# Patient Record
Sex: Male | Born: 1970 | Race: White | Hispanic: No | Marital: Married | Smoking: Never smoker
Health system: Southern US, Community
[De-identification: ages and names within clinical notes are randomized; demographics above are authoritative.]

## PROBLEM LIST (undated history)

## (undated) DIAGNOSIS — T8859XA Other complications of anesthesia, initial encounter: Secondary | ICD-10-CM

## (undated) HISTORY — PX: SHOULDER SURGERY: SHX246

## (undated) HISTORY — PX: HERNIA REPAIR: SHX51

## (undated) HISTORY — PX: KNEE SURGERY: SHX244

---

## 1998-02-14 ENCOUNTER — Emergency Department (HOSPITAL_COMMUNITY): Admission: EM | Admit: 1998-02-14 | Discharge: 1998-02-14 | Payer: Self-pay | Admitting: Emergency Medicine

## 1998-02-14 ENCOUNTER — Encounter: Payer: Self-pay | Admitting: Emergency Medicine

## 1998-02-15 ENCOUNTER — Encounter: Admission: RE | Admit: 1998-02-15 | Discharge: 1998-05-16 | Payer: Self-pay | Admitting: *Deleted

## 1998-02-21 ENCOUNTER — Encounter: Admission: RE | Admit: 1998-02-21 | Discharge: 1998-05-22 | Payer: Self-pay | Admitting: *Deleted

## 1998-09-15 ENCOUNTER — Emergency Department (HOSPITAL_COMMUNITY): Admission: EM | Admit: 1998-09-15 | Discharge: 1998-09-15 | Payer: Self-pay | Admitting: Emergency Medicine

## 1998-09-15 ENCOUNTER — Encounter: Payer: Self-pay | Admitting: Emergency Medicine

## 1999-11-07 ENCOUNTER — Emergency Department (HOSPITAL_COMMUNITY): Admission: EM | Admit: 1999-11-07 | Discharge: 1999-11-07 | Payer: Self-pay | Admitting: Emergency Medicine

## 2000-01-18 ENCOUNTER — Emergency Department (HOSPITAL_COMMUNITY): Admission: EM | Admit: 2000-01-18 | Discharge: 2000-01-18 | Payer: Self-pay | Admitting: *Deleted

## 2000-04-24 ENCOUNTER — Emergency Department (HOSPITAL_COMMUNITY): Admission: EM | Admit: 2000-04-24 | Discharge: 2000-04-24 | Payer: Self-pay | Admitting: Emergency Medicine

## 2000-04-24 ENCOUNTER — Encounter: Payer: Self-pay | Admitting: Emergency Medicine

## 2001-06-03 ENCOUNTER — Emergency Department (HOSPITAL_COMMUNITY): Admission: EM | Admit: 2001-06-03 | Discharge: 2001-06-03 | Payer: Self-pay | Admitting: Emergency Medicine

## 2001-07-10 ENCOUNTER — Emergency Department (HOSPITAL_COMMUNITY): Admission: EM | Admit: 2001-07-10 | Discharge: 2001-07-10 | Payer: Self-pay | Admitting: *Deleted

## 2002-08-04 ENCOUNTER — Emergency Department (HOSPITAL_COMMUNITY): Admission: EM | Admit: 2002-08-04 | Discharge: 2002-08-04 | Payer: Self-pay | Admitting: Emergency Medicine

## 2003-04-21 ENCOUNTER — Emergency Department (HOSPITAL_COMMUNITY): Admission: EM | Admit: 2003-04-21 | Discharge: 2003-04-21 | Payer: Self-pay | Admitting: Emergency Medicine

## 2008-03-30 ENCOUNTER — Emergency Department (HOSPITAL_COMMUNITY): Admission: EM | Admit: 2008-03-30 | Discharge: 2008-03-30 | Payer: Self-pay | Admitting: Emergency Medicine

## 2013-11-09 ENCOUNTER — Encounter (HOSPITAL_COMMUNITY): Payer: Self-pay | Admitting: Emergency Medicine

## 2013-11-09 ENCOUNTER — Emergency Department (HOSPITAL_COMMUNITY): Payer: Managed Care, Other (non HMO)

## 2013-11-09 DIAGNOSIS — R079 Chest pain, unspecified: Secondary | ICD-10-CM | POA: Insufficient documentation

## 2013-11-09 LAB — BASIC METABOLIC PANEL
Anion gap: 14 (ref 5–15)
BUN: 16 mg/dL (ref 6–23)
CALCIUM: 9.2 mg/dL (ref 8.4–10.5)
CO2: 25 mEq/L (ref 19–32)
Chloride: 102 mEq/L (ref 96–112)
Creatinine, Ser: 1.05 mg/dL (ref 0.50–1.35)
GFR calc Af Amer: >90 mL/min (ref >90)
GFR, EST NON AFRICAN AMERICAN: 85 mL/min — AB (ref >90)
Glucose, Bld: 187 mg/dL — ABNORMAL HIGH (ref 70–99)
Potassium: 4.4 mEq/L (ref 3.7–5.3)
SODIUM: 141 meq/L (ref 137–147)

## 2013-11-09 LAB — CBC
HCT: 43.4 % (ref 39.0–52.0)
HEMOGLOBIN: 15.6 g/dL (ref 13.0–17.0)
MCH: 30 pg (ref 26.0–34.0)
MCHC: 35.9 g/dL (ref 30.0–36.0)
MCV: 83.5 fL (ref 78.0–100.0)
PLATELETS: 295 10*3/uL (ref 150–400)
RBC: 5.2 MIL/uL (ref 4.22–5.81)
RDW: 12.8 % (ref 11.5–15.5)
WBC: 8.7 10*3/uL (ref 4.0–10.5)

## 2013-11-09 LAB — I-STAT TROPONIN, ED
TROPONIN I, POC: 0 ng/mL (ref 0.00–0.08)
Troponin i, poc: 0 ng/mL (ref 0.00–0.08)
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
## 2013-11-10 ENCOUNTER — Emergency Department (HOSPITAL_COMMUNITY)
Admission: EM | Admit: 2013-11-10 | Discharge: 2013-11-10 | Discharge status: Against Medical Advice | Payer: Managed Care, Other (non HMO) | Attending: Emergency Medicine | Admitting: Emergency Medicine
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
## 2021-02-02 ENCOUNTER — Ambulatory Visit: Payer: Self-pay

## 2021-02-02 ENCOUNTER — Ambulatory Visit (INDEPENDENT_AMBULATORY_CARE_PROVIDER_SITE_OTHER): Payer: PRIVATE HEALTH INSURANCE | Admitting: Orthopaedic Surgery

## 2021-02-02 ENCOUNTER — Other Ambulatory Visit: Payer: Self-pay

## 2021-02-02 VITALS — Ht 69.0 in | Wt 242.0 lb

## 2021-02-02 DIAGNOSIS — M1712 Unilateral primary osteoarthritis, left knee: Secondary | ICD-10-CM | POA: Diagnosis not present

## 2021-02-02 NOTE — Progress Notes (Signed)
Office Visit Note   Patient: Carlos Aguirre           Date of Birth: 1971-04-12           MRN: [id number] Visit Date: 02/02/2021              Requested by: No referring provider defined for this encounter. PCP: No primary care provider on file.   Assessment & Plan: Visit Diagnoses:  1. Primary osteoarthritis of left knee     Plan: Based on findings impression is end-stage left knee DJD with varus deformity.  Conservative management is now no longer effective which she has been doing for the last 10 years.  Based on treatment options to include continued conservative management versus knee replacement surgery he has elected to proceed with a left knee replacement in the near future.  Risk benefits rehab recovery anticipated time out of work reviewed with the patient.  He would be a good candidate for press-fit knee.  Follow-Up Instructions: No follow-ups on file.   Orders:  Orders Placed This Encounter  Procedures   XR KNEE 3 VIEW LEFT   No orders of the defined types were placed in this encounter.     Procedures: No procedures performed   Clinical Data: No additional findings.   Subjective: Chief Complaint  Patient presents with   Left Knee - Pain    Carlos Aguirre is a very pleasant 50 year old gentleman here with his wife for evaluation and of chronic and severe left knee pain for years.  He underwent knee arthroscopy in 2013 by Dr. Thomasena Edis for torn medial meniscus.  Intraoperative findings were consistent with advanced chondromalacia as well and he was told that he would likely need a knee replacement in the future.  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  He endorses constant start up pain and stiffness and inability to fully extend the knee.  He has had cortisone injections in the past without any relief and in fact it made it worse.  He is severely limited by the knee with pretty much all daily activities.  He has chronic nighttime pain as  well.   Review of Systems  Constitutional: Negative.   All other systems reviewed and are negative.   Objective: Vital Signs: Ht 5\' 9"  (1.753 m)   Wt 242 lb (109.8 kg)   BMI 35.74 kg/m   Physical Exam Vitals and nursing note reviewed.  Constitutional:      Appearance: He is well-developed.  Pulmonary:     Effort: Pulmonary effort is normal.  Abdominal:     Palpations: Abdomen is soft.  Skin:    General: Skin is warm.  Neurological:     Mental Status: He is alert and oriented to person, place, and time.  Psychiatric:        Behavior: Behavior normal.        Thought Content: Thought content normal.        Judgment: Judgment normal.    Ortho Exam  Left knee exam shows varus deformity.  About 10 degrees flexion contracture.  Severe pain with flexion past 80 degrees with 2+ patellofemoral crepitus.  Medial joint line tenderness.  Collaterals and cruciates are stable.  Specialty Comments:  No specialty comments available.  Imaging: XR KNEE 3 VIEW LEFT  Result Date: 02/02/2021 Bone-on-bone joint space narrowing of the medial compartment.  Evidence of prior CPPD arthropathy.    PMFS History: Patient Active Problem List   Diagnosis Date  Noted   Primary osteoarthritis of left knee 02/02/2021   No past medical history on file.  No family history on file.  Past Surgical History:  Procedure Laterality Date   KNEE SURGERY     SHOULDER SURGERY     Social History   Occupational History   Not on file  Tobacco Use   Smoking status: Never   Smokeless tobacco: Not on file  Substance and Sexual Activity   Alcohol use: No   Drug use: No   Sexual activity: Not on file

## 2021-03-22 ENCOUNTER — Other Ambulatory Visit: Payer: Self-pay

## 2021-03-26 ENCOUNTER — Other Ambulatory Visit: Payer: Self-pay | Admitting: Physician Assistant

## 2021-03-26 MED ORDER — METHOCARBAMOL 500 MG PO TABS: 500.0000 mg | ORAL_TABLET | Freq: Two times a day (BID) | ORAL | 2 refills | Status: DC | PRN

## 2021-03-26 MED ORDER — ASPIRIN EC 81 MG PO TBEC: 81.0000 mg | DELAYED_RELEASE_TABLET | Freq: Two times a day (BID) | ORAL | 0 refills | Status: AC

## 2021-03-26 MED ORDER — DOCUSATE SODIUM 100 MG PO CAPS: 100.0000 mg | ORAL_CAPSULE | Freq: Every day | ORAL | 2 refills | Status: AC | PRN

## 2021-03-26 MED ORDER — OXYCODONE-ACETAMINOPHEN 5-325 MG PO TABS: 1.0000 | ORAL_TABLET | Freq: Four times a day (QID) | ORAL | 0 refills | Status: DC | PRN

## 2021-03-26 MED ORDER — ONDANSETRON HCL 4 MG PO TABS: 4.0000 mg | ORAL_TABLET | Freq: Three times a day (TID) | ORAL | 0 refills | Status: DC | PRN

## 2021-03-28 NOTE — Progress Notes (Signed)
Surgical Instructions    Your procedure is scheduled on Monday December 19th.  Report to [facility] Main Entrance "A" at 8 A.M., then check in with the Admitting office.  Call this number if you have problems the morning of surgery:  [phone]   If you have any questions prior to your surgery date call [phone]: Open Monday-Friday 8am-4pm    Remember:  Do not eat after midnight the night before your surgery  You may drink clear liquids until 7am the morning of your surgery.   Clear liquids allowed are: Water, Non-Citrus Juices (without pulp), Carbonated Beverages, Clear Tea, Black Coffee ONLY (NO MILK, CREAM OR POWDERED CREAMER of any kind), and Gatorade    Enhanced Recovery after Surgery for Orthopedics Enhanced Recovery after Surgery is a protocol used to improve the stress on your body and your recovery after surgery.  Patient Instructions  The day of surgery (if you do NOT have diabetes):  Drink ONE (1) Pre-Surgery Clear Ensure by ___7__ am the morning of surgery   This drink was given to you during your hospital  pre-op appointment visit. Nothing else to drink after completing the  Pre-Surgery Clear Ensure.          If you have questions, please contact your surgeons office.   Take these medicines the morning of surgery with A SIP OF WATER ondansetron (ZOFRAN) 4 MG tablet if needed     As of today, STOP taking any Aspirin (unless otherwise instructed by your surgeon) Aleve, Naproxen, Ibuprofen, Motrin, Advil, Goody's, BC's, all herbal medications, fish oil, and all vitamins.     After your COVID test   You are not required to quarantine however you are required to wear a well-fitting mask when you are out and around people not in your household.  If your mask becomes wet or soiled, replace with a new one.  Wash your hands often with soap and water for 20 seconds or clean your hands with an alcohol-based hand sanitizer that contains at least 60%  alcohol.  Do not share personal items.  Notify your provider: if you are in close contact with someone who has COVID  or if you develop a fever of 100.4 or greater, sneezing, cough, sore throat, shortness of breath or body aches.             Do not wear jewelry  Do not wear lotions, powders, colognes, or deodorant. Do not shave 48 hours prior to surgery.  Men may shave face and neck. Do not bring valuables to the hospital. DO Not wear nail polish, gel polish, artificial nails, or any other type of covering on natural nails including finger and toenails. If patients have artificial nails, gel coating, etc. that need to be removed by a nail salon, please have this removed prior to surgery or surgery may need to be canceled/delayed if the surgeon/ anesthesia feels like the patient is unable to be adequately monitored.             [facility] is not responsible for any belongings or valuables.  Do NOT Smoke (Tobacco/Vaping)  24 hours prior to your procedure  If you use a CPAP at night, you may bring your mask for your overnight stay.   Contacts, glasses, hearing aids, dentures or partials may not be worn into surgery, please bring cases for these belongings   For patients admitted to the hospital, discharge time will be determined by your treatment team.   Patients discharged the  day of surgery will not be allowed to drive home, and someone needs to stay with them for 24 hours.  NO VISITORS WILL BE ALLOWED IN PRE-OP WHERE PATIENTS ARE PREPPED FOR SURGERY.  ONLY 1 SUPPORT PERSON MAY BE PRESENT IN THE WAITING ROOM WHILE YOU ARE IN SURGERY.  IF YOU ARE TO BE ADMITTED, ONCE YOU ARE IN YOUR ROOM YOU WILL BE ALLOWED TWO (2) VISITORS. 1 (ONE) VISITOR MAY STAY OVERNIGHT BUT MUST ARRIVE TO THE ROOM BY 8pm.  Minor children may have two parents present. Special consideration for safety and communication needs will be reviewed on a case by case basis.  Special instructions:    Oral Hygiene is also  important to reduce your risk of infection.  Remember - BRUSH YOUR TEETH THE MORNING OF SURGERY WITH YOUR REGULAR TOOTHPASTE   [facility]- Preparing For Surgery  Before surgery, you can play an important role. Because skin is not sterile, your skin needs to be as free of germs as possible. You can reduce the number of germs on your skin by washing with CHG (chlorahexidine gluconate) Soap before surgery.  CHG is an antiseptic cleaner which kills germs and bonds with the skin to continue killing germs even after washing.     Please do not use if you have an allergy to CHG or antibacterial soaps. If your skin becomes reddened/irritated stop using the CHG.  Do not shave (including legs and underarms) for at least 48 hours prior to first CHG shower. It is OK to shave your face.  Please follow these instructions carefully.     Shower the NIGHT BEFORE SURGERY and the MORNING OF SURGERY with CHG Soap.   If you chose to wash your hair, wash your hair first as usual with your normal shampoo. After you shampoo, rinse your hair and body thoroughly to remove the shampoo.  Then [employer] and genitals (private parts) with your normal soap and rinse thoroughly to remove soap.  After that Use CHG Soap as you would any other liquid soap. You can apply CHG directly to the skin and wash gently with a scrungie or a clean washcloth.   Apply the CHG Soap to your body ONLY FROM THE NECK DOWN.  Do not use on open wounds or open sores. Avoid contact with your eyes, ears, mouth and genitals (private parts). Wash Face and genitals (private parts)  with your normal soap.   Wash thoroughly, paying special attention to the area where your surgery will be performed.  Thoroughly rinse your body with warm water from the neck down.  DO NOT shower/wash with your normal soap after using and rinsing off the CHG Soap.  Pat yourself dry with a CLEAN TOWEL.  Wear CLEAN PAJAMAS to bed the night before surgery  Place CLEAN  SHEETS on your bed the night before your surgery  DO NOT SLEEP WITH PETS.   Day of Surgery:  Take a shower with CHG soap. Wear Clean/Comfortable clothing the morning of surgery Do not apply any deodorants/lotions.   Remember to brush your teeth WITH YOUR REGULAR TOOTHPASTE.   Please read over the following fact sheets that you were given.

## 2021-03-29 ENCOUNTER — Other Ambulatory Visit: Payer: Self-pay

## 2021-03-29 ENCOUNTER — Encounter (HOSPITAL_COMMUNITY)
Admission: RE | Admit: 2021-03-29 | Discharge: 2021-03-29 | Disposition: A | Discharge status: Home / Self Care | Payer: Managed Care, Other (non HMO) | Source: Ambulatory Visit | Attending: Orthopaedic Surgery | Admitting: Orthopaedic Surgery

## 2021-03-29 ENCOUNTER — Encounter (HOSPITAL_COMMUNITY): Payer: Self-pay

## 2021-03-29 VITALS — BP 154/85 | HR 92 | Temp 97.9°F | Resp 18 | Ht 68.0 in | Wt 248.7 lb

## 2021-03-29 DIAGNOSIS — M1712 Unilateral primary osteoarthritis, left knee: Secondary | ICD-10-CM | POA: Diagnosis not present

## 2021-03-29 DIAGNOSIS — Z01818 Encounter for other preprocedural examination: Secondary | ICD-10-CM

## 2021-03-29 DIAGNOSIS — Z20822 Contact with and (suspected) exposure to covid-19: Secondary | ICD-10-CM | POA: Diagnosis not present

## 2021-03-29 DIAGNOSIS — Z01812 Encounter for preprocedural laboratory examination: Secondary | ICD-10-CM | POA: Insufficient documentation

## 2021-03-29 HISTORY — DX: Other complications of anesthesia, initial encounter: T88.59XA

## 2021-03-29 LAB — CBC WITH DIFFERENTIAL/PLATELET
Abs Immature Granulocytes: 0.12 10*3/uL — ABNORMAL HIGH (ref 0.00–0.07)
Basophils Absolute: 0.1 10*3/uL (ref 0.0–0.1)
Basophils Relative: 1 %
Eosinophils Absolute: 0.1 10*3/uL (ref 0.0–0.5)
Eosinophils Relative: 1 %
HCT: 47.3 % (ref 39.0–52.0)
Hemoglobin: 16.8 g/dL (ref 13.0–17.0)
Immature Granulocytes: 1 %
Lymphocytes Relative: 35 %
Lymphs Abs: 3.3 10*3/uL (ref 0.7–4.0)
MCH: 29.6 pg (ref 26.0–34.0)
MCHC: 35.5 g/dL (ref 30.0–36.0)
MCV: 83.4 fL (ref 80.0–100.0)
Monocytes Absolute: 0.6 10*3/uL (ref 0.1–1.0)
Monocytes Relative: 6 %
Neutro Abs: 5.4 10*3/uL (ref 1.7–7.7)
Neutrophils Relative %: 56 %
Platelets: 310 10*3/uL (ref 150–400)
RBC: 5.67 MIL/uL (ref 4.22–5.81)
RDW: 12.8 % (ref 11.5–15.5)
WBC: 9.6 10*3/uL (ref 4.0–10.5)
nRBC: 0 % (ref 0.0–0.2)

## 2021-03-29 LAB — URINALYSIS, ROUTINE W REFLEX MICROSCOPIC
Bilirubin Urine: NEGATIVE
Glucose, UA: NEGATIVE mg/dL
Hgb urine dipstick: NEGATIVE
Ketones, ur: NEGATIVE mg/dL
Leukocytes,Ua: NEGATIVE
Nitrite: NEGATIVE
Protein, ur: NEGATIVE mg/dL
Specific Gravity, Urine: >1.03 — ABNORMAL HIGH (ref 1.005–1.030)
pH: 5.5 (ref 5.0–8.0)

## 2021-03-29 LAB — PROTIME-INR
INR: 1.2 (ref 0.8–1.2)
Prothrombin Time: 15.1 seconds (ref 11.4–15.2)

## 2021-03-29 LAB — COMPREHENSIVE METABOLIC PANEL
ALT: 62 U/L — ABNORMAL HIGH (ref 0–44)
AST: 38 U/L (ref 15–41)
Albumin: 4.1 g/dL (ref 3.5–5.0)
Alkaline Phosphatase: 100 U/L (ref 38–126)
Anion gap: 8 (ref 5–15)
BUN: 12 mg/dL (ref 6–20)
CO2: 26 mmol/L (ref 22–32)
Calcium: 9.5 mg/dL (ref 8.9–10.3)
Chloride: 101 mmol/L (ref 98–111)
Creatinine, Ser: 1.14 mg/dL (ref 0.61–1.24)
GFR, Estimated: >60 mL/min (ref >60)
Glucose, Bld: 129 mg/dL — ABNORMAL HIGH (ref 70–99)
Potassium: 3.9 mmol/L (ref 3.5–5.1)
Sodium: 135 mmol/L (ref 135–145)
Total Bilirubin: 0.7 mg/dL (ref 0.3–1.2)
Total Protein: 8.4 g/dL — ABNORMAL HIGH (ref 6.5–8.1)

## 2021-03-29 LAB — SURGICAL PCR SCREEN
MRSA, PCR: NEGATIVE
Staphylococcus aureus: NEGATIVE

## 2021-03-29 LAB — APTT: aPTT: 33 seconds (ref 24–36)

## 2021-03-29 LAB — SARS CORONAVIRUS 2 (TAT 6-24 HRS): SARS Coronavirus 2: NEGATIVE

## 2021-03-29 NOTE — Progress Notes (Signed)
PCP - No PCP Cardiologist - Denies  PPM/ICD - Denies  Chest x-ray - N/A EKG - Denies Stress Test - Denies ECHO - Denies Cardiac Cath - Denies  Diabetic: Denies  Sleep Study - Denies  Blood Thinner Instructions: N/A Aspirin Instructions: N/A  ERAS Protcol - Yes, PRE-SURGERY Ensure  COVID TEST- 03/29/21 in PAT   Anesthesia review: No  Patient denies shortness of breath, fever, cough and chest pain at PAT appointment   All instructions explained to the patient, with a verbal understanding of the material. Patient agrees to go over the instructions while at home for a better understanding. Patient also instructed to self quarantine after being tested for COVID-19. The opportunity to ask questions was provided.

## 2021-03-30 MED ORDER — TRANEXAMIC ACID 1000 MG/10ML IV SOLN
2000.0000 mg | INTRAVENOUS | Status: DC
  Filled 2021-03-30: qty 20

## 2021-04-02 ENCOUNTER — Ambulatory Visit (HOSPITAL_COMMUNITY): Payer: PRIVATE HEALTH INSURANCE | Admitting: Anesthesiology

## 2021-04-02 ENCOUNTER — Other Ambulatory Visit: Payer: Self-pay

## 2021-04-02 ENCOUNTER — Encounter (HOSPITAL_COMMUNITY)
Admission: RE | Disposition: A | Discharge status: Home / Self Care | Payer: Self-pay | Source: Ambulatory Visit | Attending: Orthopaedic Surgery

## 2021-04-02 ENCOUNTER — Observation Stay (HOSPITAL_COMMUNITY): Payer: PRIVATE HEALTH INSURANCE

## 2021-04-02 ENCOUNTER — Encounter (HOSPITAL_COMMUNITY): Payer: Self-pay | Admitting: Orthopaedic Surgery

## 2021-04-02 ENCOUNTER — Observation Stay (HOSPITAL_COMMUNITY)
Admission: RE | Admit: 2021-04-02 | Discharge: 2021-04-03 | Disposition: A | Discharge status: Home / Self Care | Payer: PRIVATE HEALTH INSURANCE | Source: Ambulatory Visit | Attending: Orthopaedic Surgery | Admitting: Orthopaedic Surgery

## 2021-04-02 DIAGNOSIS — M1712 Unilateral primary osteoarthritis, left knee: Secondary | ICD-10-CM

## 2021-04-02 DIAGNOSIS — R52 Pain, unspecified: Secondary | ICD-10-CM

## 2021-04-02 DIAGNOSIS — Z96652 Presence of left artificial knee joint: Secondary | ICD-10-CM

## 2021-04-02 HISTORY — PX: TOTAL KNEE ARTHROPLASTY: SHX125

## 2021-04-02 SURGERY — ARTHROPLASTY, KNEE, TOTAL
Anesthesia: Regional | Site: Knee | Laterality: Left

## 2021-04-02 MED ORDER — VANCOMYCIN HCL 1000 MG IV SOLR
INTRAVENOUS | Status: AC
  Filled 2021-04-02: qty 20

## 2021-04-02 MED ORDER — KETOROLAC TROMETHAMINE 15 MG/ML IJ SOLN
INTRAMUSCULAR | Status: AC
  Administered 2021-04-02: 13:00:00 15 mg via INTRAVENOUS
  Filled 2021-04-02: qty 1

## 2021-04-02 MED ORDER — ACETAMINOPHEN 325 MG PO TABS: 325.0000 mg | ORAL_TABLET | Freq: Four times a day (QID) | ORAL | Status: DC | PRN

## 2021-04-02 MED ORDER — MIDAZOLAM HCL 2 MG/2ML IJ SOLN
INTRAMUSCULAR | Status: AC
  Filled 2021-04-02: qty 2

## 2021-04-02 MED ORDER — METOCLOPRAMIDE HCL 5 MG PO TABS: 5.0000 mg | ORAL_TABLET | Freq: Three times a day (TID) | ORAL | Status: DC | PRN

## 2021-04-02 MED ORDER — HYDROMORPHONE HCL 1 MG/ML IJ SOLN: 0.2500 mg | INTRAMUSCULAR | Status: DC | PRN

## 2021-04-02 MED ORDER — TRANEXAMIC ACID-NACL 1000-0.7 MG/100ML-% IV SOLN
1000.0000 mg | INTRAVENOUS | Status: AC
  Administered 2021-04-02: 10:00:00 1000 mg via INTRAVENOUS
  Filled 2021-04-02: qty 100

## 2021-04-02 MED ORDER — LIDOCAINE 2% (20 MG/ML) 5 ML SYRINGE
INTRAMUSCULAR | Status: AC
  Filled 2021-04-02: qty 5

## 2021-04-02 MED ORDER — DOCUSATE SODIUM 100 MG PO CAPS
100.0000 mg | ORAL_CAPSULE | Freq: Two times a day (BID) | ORAL | Status: DC
  Administered 2021-04-02 – 2021-04-03 (×2): 100 mg via ORAL
  Filled 2021-04-02 (×2): qty 1

## 2021-04-02 MED ORDER — BUPIVACAINE IN DEXTROSE 0.75-8.25 % IT SOLN
INTRATHECAL | Status: DC | PRN
  Administered 2021-04-02: 15 mg via INTRATHECAL

## 2021-04-02 MED ORDER — MIDAZOLAM HCL 2 MG/2ML IJ SOLN
INTRAMUSCULAR | Status: AC
  Administered 2021-04-02: 09:00:00 2 mg via INTRAVENOUS
  Filled 2021-04-02: qty 2

## 2021-04-02 MED ORDER — FENTANYL CITRATE (PF) 100 MCG/2ML IJ SOLN
INTRAMUSCULAR | Status: AC
  Administered 2021-04-02: 09:00:00 100 ug via INTRAVENOUS
  Filled 2021-04-02: qty 2

## 2021-04-02 MED ORDER — KETOROLAC TROMETHAMINE 15 MG/ML IJ SOLN
15.0000 mg | Freq: Four times a day (QID) | INTRAMUSCULAR | Status: AC
  Administered 2021-04-02 – 2021-04-03 (×3): 15 mg via INTRAVENOUS
  Filled 2021-04-02 (×3): qty 1

## 2021-04-02 MED ORDER — PROPOFOL 500 MG/50ML IV EMUL
INTRAVENOUS | Status: DC | PRN
Start: 2021-04-02 — End: 2021-04-02
  Administered 2021-04-02: 75 ug/kg/min via INTRAVENOUS

## 2021-04-02 MED ORDER — TRANEXAMIC ACID-NACL 1000-0.7 MG/100ML-% IV SOLN
1000.0000 mg | Freq: Once | INTRAVENOUS | Status: AC
  Administered 2021-04-02: 15:00:00 1000 mg via INTRAVENOUS
  Filled 2021-04-02: qty 100

## 2021-04-02 MED ORDER — HYDROMORPHONE HCL 1 MG/ML IJ SOLN
0.5000 mg | INTRAMUSCULAR | Status: DC | PRN
  Administered 2021-04-02: 15:00:00 1 mg via INTRAVENOUS
  Filled 2021-04-02: qty 1

## 2021-04-02 MED ORDER — TRANEXAMIC ACID 1000 MG/10ML IV SOLN
INTRAVENOUS | Status: DC | PRN
  Administered 2021-04-02: 10:00:00 2000 mg via TOPICAL

## 2021-04-02 MED ORDER — FENTANYL CITRATE (PF) 100 MCG/2ML IJ SOLN: 100.0000 ug | Freq: Once | INTRAMUSCULAR | Status: AC

## 2021-04-02 MED ORDER — BUPIVACAINE-MELOXICAM ER 400-12 MG/14ML IJ SOLN
INTRAMUSCULAR | Status: DC | PRN
  Administered 2021-04-02: 400 mg

## 2021-04-02 MED ORDER — METOCLOPRAMIDE HCL 5 MG/ML IJ SOLN: 5.0000 mg | Freq: Three times a day (TID) | INTRAMUSCULAR | Status: DC | PRN

## 2021-04-02 MED ORDER — IRRISEPT - 450ML BOTTLE WITH 0.05% CHG IN STERILE WATER, USP 99.95% OPTIME
TOPICAL | Status: DC | PRN
  Administered 2021-04-02: 10:00:00 450 mL via TOPICAL

## 2021-04-02 MED ORDER — DEXAMETHASONE SODIUM PHOSPHATE 10 MG/ML IJ SOLN
INTRAMUSCULAR | Status: AC
  Filled 2021-04-02: qty 1

## 2021-04-02 MED ORDER — METHOCARBAMOL 1000 MG/10ML IJ SOLN
500.0000 mg | Freq: Four times a day (QID) | INTRAVENOUS | Status: DC | PRN
  Filled 2021-04-02: qty 5

## 2021-04-02 MED ORDER — DEXAMETHASONE SODIUM PHOSPHATE 10 MG/ML IJ SOLN
INTRAMUSCULAR | Status: DC | PRN
  Administered 2021-04-02: 5 mg via INTRAVENOUS

## 2021-04-02 MED ORDER — SODIUM CHLORIDE 0.9 % IR SOLN
Status: DC | PRN
  Administered 2021-04-02: 3000 mL

## 2021-04-02 MED ORDER — CEFAZOLIN SODIUM-DEXTROSE 2-4 GM/100ML-% IV SOLN
2.0000 g | INTRAVENOUS | Status: AC
  Administered 2021-04-02: 09:00:00 2 g via INTRAVENOUS
  Filled 2021-04-02: qty 100

## 2021-04-02 MED ORDER — PROMETHAZINE HCL 25 MG/ML IJ SOLN: 6.2500 mg | INTRAMUSCULAR | Status: DC | PRN

## 2021-04-02 MED ORDER — ACETAMINOPHEN 500 MG PO TABS
1000.0000 mg | ORAL_TABLET | Freq: Four times a day (QID) | ORAL | Status: AC
  Administered 2021-04-02 – 2021-04-03 (×3): 1000 mg via ORAL
  Filled 2021-04-02 (×3): qty 2

## 2021-04-02 MED ORDER — ONDANSETRON HCL 4 MG PO TABS: 4.0000 mg | ORAL_TABLET | Freq: Four times a day (QID) | ORAL | Status: DC | PRN

## 2021-04-02 MED ORDER — ONDANSETRON HCL 4 MG/2ML IJ SOLN
INTRAMUSCULAR | Status: AC
  Filled 2021-04-02: qty 2

## 2021-04-02 MED ORDER — CHLORHEXIDINE GLUCONATE 0.12 % MT SOLN
OROMUCOSAL | Status: AC
  Administered 2021-04-02: 08:00:00 15 mL
  Filled 2021-04-02: qty 15

## 2021-04-02 MED ORDER — OXYCODONE HCL 5 MG PO TABS
5.0000 mg | ORAL_TABLET | ORAL | Status: DC | PRN
  Administered 2021-04-02 – 2021-04-03 (×3): 10 mg via ORAL
  Administered 2021-04-03: 04:00:00 5 mg via ORAL
  Filled 2021-04-02 (×4): qty 2

## 2021-04-02 MED ORDER — OXYCODONE HCL ER 10 MG PO T12A
10.0000 mg | EXTENDED_RELEASE_TABLET | Freq: Two times a day (BID) | ORAL | Status: DC
  Administered 2021-04-02 – 2021-04-03 (×3): 10 mg via ORAL
  Filled 2021-04-02 (×3): qty 1

## 2021-04-02 MED ORDER — OXYCODONE HCL 5 MG/5ML PO SOLN: 5.0000 mg | Freq: Once | ORAL | Status: DC | PRN

## 2021-04-02 MED ORDER — ONDANSETRON HCL 4 MG/2ML IJ SOLN
4.0000 mg | Freq: Four times a day (QID) | INTRAMUSCULAR | Status: DC | PRN
  Administered 2021-04-02: 17:00:00 4 mg via INTRAVENOUS
  Filled 2021-04-02: qty 2

## 2021-04-02 MED ORDER — LACTATED RINGERS IV SOLN: INTRAVENOUS | Status: DC

## 2021-04-02 MED ORDER — METHOCARBAMOL 500 MG PO TABS
500.0000 mg | ORAL_TABLET | Freq: Four times a day (QID) | ORAL | Status: DC | PRN
  Administered 2021-04-02 – 2021-04-03 (×3): 500 mg via ORAL
  Filled 2021-04-02 (×3): qty 1

## 2021-04-02 MED ORDER — MEPERIDINE HCL 25 MG/ML IJ SOLN: 6.2500 mg | INTRAMUSCULAR | Status: DC | PRN

## 2021-04-02 MED ORDER — SODIUM CHLORIDE 0.9 % IV SOLN: INTRAVENOUS | Status: DC

## 2021-04-02 MED ORDER — MENTHOL 3 MG MT LOZG: 1.0000 | LOZENGE | OROMUCOSAL | Status: DC | PRN

## 2021-04-02 MED ORDER — CEFAZOLIN SODIUM-DEXTROSE 2-4 GM/100ML-% IV SOLN
2.0000 g | Freq: Four times a day (QID) | INTRAVENOUS | Status: AC
  Administered 2021-04-02 (×2): 2 g via INTRAVENOUS
  Filled 2021-04-02 (×2): qty 100

## 2021-04-02 MED ORDER — FENTANYL CITRATE (PF) 250 MCG/5ML IJ SOLN
INTRAMUSCULAR | Status: AC
  Filled 2021-04-02: qty 5

## 2021-04-02 MED ORDER — PROPOFOL 10 MG/ML IV BOLUS
INTRAVENOUS | Status: AC
  Filled 2021-04-02: qty 20

## 2021-04-02 MED ORDER — MIDAZOLAM HCL 2 MG/2ML IJ SOLN: 0.5000 mg | Freq: Once | INTRAMUSCULAR | Status: DC | PRN

## 2021-04-02 MED ORDER — PHENYLEPHRINE 40 MCG/ML (10ML) SYRINGE FOR IV PUSH (FOR BLOOD PRESSURE SUPPORT)
PREFILLED_SYRINGE | INTRAVENOUS | Status: DC | PRN
  Administered 2021-04-02 (×2): 80 ug via INTRAVENOUS
  Administered 2021-04-02: 120 ug via INTRAVENOUS

## 2021-04-02 MED ORDER — POVIDONE-IODINE 10 % EX SWAB
2.0000 "application " | Freq: Once | CUTANEOUS | Status: AC
  Administered 2021-04-02: 2 via TOPICAL

## 2021-04-02 MED ORDER — VANCOMYCIN HCL 1000 MG IV SOLR
INTRAVENOUS | Status: DC | PRN
  Administered 2021-04-02: 1000 mg via TOPICAL

## 2021-04-02 MED ORDER — BUPIVACAINE-MELOXICAM ER 200-6 MG/7ML IJ SOLN
INTRAMUSCULAR | Status: AC
  Filled 2021-04-02: qty 1

## 2021-04-02 MED ORDER — OXYCODONE HCL 5 MG PO TABS: 10.0000 mg | ORAL_TABLET | ORAL | Status: DC | PRN

## 2021-04-02 MED ORDER — LIDOCAINE 2% (20 MG/ML) 5 ML SYRINGE
INTRAMUSCULAR | Status: DC | PRN
  Administered 2021-04-02: 20 mg via INTRAVENOUS

## 2021-04-02 MED ORDER — PROPOFOL 10 MG/ML IV BOLUS
INTRAVENOUS | Status: DC | PRN
  Administered 2021-04-02: 30 mg via INTRAVENOUS

## 2021-04-02 MED ORDER — MIDAZOLAM HCL 2 MG/2ML IJ SOLN: 2.0000 mg | Freq: Once | INTRAMUSCULAR | Status: AC

## 2021-04-02 MED ORDER — ACETAMINOPHEN 500 MG PO TABS
1000.0000 mg | ORAL_TABLET | Freq: Once | ORAL | Status: AC
  Administered 2021-04-02: 08:00:00 1000 mg via ORAL
  Filled 2021-04-02: qty 2

## 2021-04-02 MED ORDER — ASPIRIN 81 MG PO CHEW
81.0000 mg | CHEWABLE_TABLET | Freq: Two times a day (BID) | ORAL | Status: DC
  Administered 2021-04-02 – 2021-04-03 (×2): 81 mg via ORAL
  Filled 2021-04-02 (×2): qty 1

## 2021-04-02 MED ORDER — DEXAMETHASONE SODIUM PHOSPHATE 10 MG/ML IJ SOLN
10.0000 mg | Freq: Once | INTRAMUSCULAR | Status: AC
  Administered 2021-04-03: 07:00:00 10 mg via INTRAVENOUS
  Filled 2021-04-02: qty 1

## 2021-04-02 MED ORDER — ROPIVACAINE HCL 7.5 MG/ML IJ SOLN
INTRAMUSCULAR | Status: DC | PRN
  Administered 2021-04-02: 20 mL via PERINEURAL

## 2021-04-02 MED ORDER — 0.9 % SODIUM CHLORIDE (POUR BTL) OPTIME
TOPICAL | Status: DC | PRN
  Administered 2021-04-02: 10:00:00 500 mL

## 2021-04-02 MED ORDER — PHENOL 1.4 % MT LIQD: 1.0000 | OROMUCOSAL | Status: DC | PRN

## 2021-04-02 MED ORDER — PHENYLEPHRINE 40 MCG/ML (10ML) SYRINGE FOR IV PUSH (FOR BLOOD PRESSURE SUPPORT)
PREFILLED_SYRINGE | INTRAVENOUS | Status: AC
  Filled 2021-04-02: qty 10

## 2021-04-02 MED ORDER — OXYCODONE HCL 5 MG PO TABS: 5.0000 mg | ORAL_TABLET | Freq: Once | ORAL | Status: DC | PRN

## 2021-04-02 MED ORDER — FENTANYL CITRATE (PF) 250 MCG/5ML IJ SOLN
INTRAMUSCULAR | Status: DC | PRN
  Administered 2021-04-02: 25 ug via INTRAVENOUS

## 2021-04-02 MED ORDER — ONDANSETRON HCL 4 MG/2ML IJ SOLN
INTRAMUSCULAR | Status: DC | PRN
  Administered 2021-04-02: 4 mg via INTRAVENOUS

## 2021-04-02 SURGICAL SUPPLY — 81 items
ALCOHOL 70% 16 OZ (MISCELLANEOUS) ×3 IMPLANT
BAG COUNTER SPONGE SURGICOUNT (BAG) IMPLANT
BAG DECANTER FOR FLEXI CONT (MISCELLANEOUS) ×3 IMPLANT
BAG SURGICOUNT SPONGE COUNTING (BAG)
BANDAGE ESMARK 6X9 LF (GAUZE/BANDAGES/DRESSINGS) IMPLANT
BLADE SAG 18X100X1.27 (BLADE) ×3 IMPLANT
BNDG ESMARK 6X9 LF (GAUZE/BANDAGES/DRESSINGS)
BOWL SMART MIX CTS (DISPOSABLE) ×3 IMPLANT
CLOSURE STERI-STRIP 1/2X4 (GAUZE/BANDAGES/DRESSINGS) ×2
CLOSURE WOUND 1/2 X4 (GAUZE/BANDAGES/DRESSINGS) ×1
CLSR STERI-STRIP ANTIMIC 1/2X4 (GAUZE/BANDAGES/DRESSINGS) ×4 IMPLANT
COMP FEM PS 9 LT STD (Joint) ×3 IMPLANT
COMP PATELLAR 10X35 METAL (Joint) ×3 IMPLANT
COMPONENT FEM PS 9 LT STD (Joint) IMPLANT
COMPONENT PATELLAR 10X35 METAL (Joint) IMPLANT
COOLER ICEMAN CLASSIC (MISCELLANEOUS) ×3 IMPLANT
COVER SURGICAL LIGHT HANDLE (MISCELLANEOUS) ×3 IMPLANT
CUFF TOURN SGL QUICK 34 (TOURNIQUET CUFF) ×2
CUFF TRNQT CYL 34X4.125X (TOURNIQUET CUFF) ×1 IMPLANT
DERMABOND ADVANCED (GAUZE/BANDAGES/DRESSINGS) ×2
DERMABOND ADVANCED .7 DNX12 (GAUZE/BANDAGES/DRESSINGS) ×1 IMPLANT
DRAPE EXTREMITY T 121X128X90 (DISPOSABLE) ×3 IMPLANT
DRAPE HALF SHEET 40X57 (DRAPES) ×3 IMPLANT
DRAPE INCISE IOBAN 66X45 STRL (DRAPES) ×3 IMPLANT
DRAPE ORTHO SPLIT 77X108 STRL (DRAPES) ×4
DRAPE POUCH INSTRU U-SHP 10X18 (DRAPES) ×3 IMPLANT
DRAPE SURG ORHT 6 SPLT 77X108 (DRAPES) ×2 IMPLANT
DRAPE U-SHAPE 47X51 STRL (DRAPES) ×6 IMPLANT
DRESSING AQUACEL AG SP 3.5X6 (GAUZE/BANDAGES/DRESSINGS) IMPLANT
DRSG AQUACEL AG ADV 3.5X10 (GAUZE/BANDAGES/DRESSINGS) ×3 IMPLANT
DRSG AQUACEL AG SP 3.5X6 (GAUZE/BANDAGES/DRESSINGS) ×3
DURAPREP 26ML APPLICATOR (WOUND CARE) ×9 IMPLANT
ELECT CAUTERY BLADE 6.4 (BLADE) ×3 IMPLANT
ELECT REM PT RETURN 9FT ADLT (ELECTROSURGICAL) ×3
ELECTRODE REM PT RTRN 9FT ADLT (ELECTROSURGICAL) ×1 IMPLANT
GLOVE SURG SYN 7.5  E (GLOVE) ×12
GLOVE SURG SYN 7.5 E (GLOVE) ×4 IMPLANT
GLOVE SURG SYN 7.5 PF PI (GLOVE) ×4 IMPLANT
GLOVE SURG UNDER LTX SZ7.5 (GLOVE) ×6 IMPLANT
GLOVE SURG UNDER POLY LF SZ7 (GLOVE) ×3 IMPLANT
GOWN STRL REIN XL XLG (GOWN DISPOSABLE) ×3 IMPLANT
GOWN STRL REUS W/ TWL LRG LVL3 (GOWN DISPOSABLE) ×1 IMPLANT
GOWN STRL REUS W/TWL LRG LVL3 (GOWN DISPOSABLE) ×3
HANDPIECE INTERPULSE COAX TIP (DISPOSABLE) ×3
HDLS TROCR DRIL PIN KNEE 75 (PIN) ×2
HOOD PEEL AWAY FLYTE STAYCOOL (MISCELLANEOUS) ×6 IMPLANT
INSERT ASF POLY 14 LT (Insert) ×2 IMPLANT
JET LAVAGE IRRISEPT WOUND (IRRIGATION / IRRIGATOR) ×3
KIT BASIN OR (CUSTOM PROCEDURE TRAY) ×3 IMPLANT
KIT TURNOVER KIT B (KITS) ×3 IMPLANT
LAVAGE JET IRRISEPT WOUND (IRRIGATION / IRRIGATOR) ×1 IMPLANT
MANIFOLD NEPTUNE II (INSTRUMENTS) ×3 IMPLANT
MARKER SKIN DUAL TIP RULER LAB (MISCELLANEOUS) ×6 IMPLANT
NDL SPNL 18GX3.5 QUINCKE PK (NEEDLE) ×1 IMPLANT
NEEDLE SPNL 18GX3.5 QUINCKE PK (NEEDLE) ×3 IMPLANT
NS IRRIG 1000ML POUR BTL (IV SOLUTION) ×3 IMPLANT
PACK TOTAL JOINT (CUSTOM PROCEDURE TRAY) ×3 IMPLANT
PAD ARMBOARD 7.5X6 YLW CONV (MISCELLANEOUS) ×6 IMPLANT
PAD COLD SHLDR WRAP-ON (PAD) ×3 IMPLANT
PIN DRILL HDLS TROCAR 75 4PK (PIN) IMPLANT
SAW OSC TIP CART 19.5X105X1.3 (SAW) ×3 IMPLANT
SCREW FEMALE HEX FIX 25X2.5 (ORTHOPEDIC DISPOSABLE SUPPLIES) ×2 IMPLANT
SET HNDPC FAN SPRY TIP SCT (DISPOSABLE) ×1 IMPLANT
SPONGE T-LAP 18X18 ~~LOC~~+RFID (SPONGE) ×4 IMPLANT
STEM TIBIAL TRAB SZF LT (Stem) ×2 IMPLANT
STRIP CLOSURE SKIN 1/2X4 (GAUZE/BANDAGES/DRESSINGS) ×1 IMPLANT
SUCTION FRAZIER HANDLE 10FR (MISCELLANEOUS) ×2
SUCTION TUBE FRAZIER 10FR DISP (MISCELLANEOUS) ×1 IMPLANT
SUT MNCRL AB 3-0 PS2 27 (SUTURE) ×2 IMPLANT
SUT VIC AB 0 CT1 27 (SUTURE) ×4
SUT VIC AB 0 CT1 27XBRD ANBCTR (SUTURE) ×2 IMPLANT
SUT VIC AB 1 CTX 27 (SUTURE) ×9 IMPLANT
SUT VIC AB 2-0 CT1 27 (SUTURE) ×8
SUT VIC AB 2-0 CT1 TAPERPNT 27 (SUTURE) ×4 IMPLANT
SYR 50ML LL SCALE MARK (SYRINGE) ×6 IMPLANT
TOWEL GREEN STERILE (TOWEL DISPOSABLE) ×3 IMPLANT
TOWEL GREEN STERILE FF (TOWEL DISPOSABLE) ×3 IMPLANT
TRAY CATH 16FR W/PLASTIC CATH (SET/KITS/TRAYS/PACK) ×2 IMPLANT
UNDERPAD 30X36 HEAVY ABSORB (UNDERPADS AND DIAPERS) ×3 IMPLANT
WARMER LAPAROSCOPE (MISCELLANEOUS) ×2 IMPLANT
YANKAUER SUCT BULB TIP NO VENT (SUCTIONS) ×6 IMPLANT

## 2021-04-02 NOTE — H&P (Signed)
PREOPERATIVE H&P  Chief Complaint: left knee degenerative joint disease  HPI: Carlos Aguirre is a 50 y.o. male who presents for surgical treatment of left knee degenerative joint disease.  He denies any changes in medical history.  Past Medical History:  Diagnosis Date   Complication of anesthesia    Hard to wake up per patient   Past Surgical History:  Procedure Laterality Date   HERNIA REPAIR     KNEE SURGERY     SHOULDER SURGERY     Social History   Socioeconomic History   Marital status: Married    Spouse name: Not on file   Number of children: Not on file   Years of education: Not on file   Highest education level: Not on file  Occupational History   Not on file  Tobacco Use   Smoking status: Never   Smokeless tobacco: Not on file  Vaping Use   Vaping Use: Never used  Substance and Sexual Activity   Alcohol use: No   Drug use: No   Sexual activity: Not on file  Other Topics Concern   Not on file  Social History Narrative   Not on file   Social Determinants of Health   Financial Resource Strain: Not on file  Food Insecurity: Not on file  Transportation Needs: Not on file  Physical Activity: Not on file  Stress: Not on file  Social Connections: Not on file   History reviewed. No pertinent family history. Allergies  Allergen Reactions   Erythromycin Other (See Comments)    Childhood allergy   Prior to Admission medications   Medication Sig Start Date End Date Taking? Authorizing Provider  aspirin EC 81 MG tablet Take 1 tablet (81 mg total) by mouth 2 (two) times daily. To be taken after surgery to prevent blood clots 03/26/21   Cristie Hem, PA-C  docusate sodium (COLACE) 100 MG capsule Take 1 capsule (100 mg total) by mouth daily as needed. 03/26/21 03/26/22  Cristie Hem, PA-C  methocarbamol (ROBAXIN) 500 MG tablet Take 1 tablet (500 mg total) by mouth 2 (two) times daily as needed. To be taken after surgery 03/26/21   Cristie Hem,  PA-C  ondansetron (ZOFRAN) 4 MG tablet Take 1 tablet (4 mg total) by mouth every 8 (eight) hours as needed for nausea or vomiting. 03/26/21   Cristie Hem, PA-C  oxyCODONE-acetaminophen (PERCOCET) 5-325 MG tablet Take 1-2 tablets by mouth every 6 (six) hours as needed. To be taken after surgery 03/26/21   Cristie Hem, PA-C     Positive ROS: All other systems have been reviewed and were otherwise negative with the exception of those mentioned in the HPI and as above.  Physical Exam: General: Alert, no acute distress Cardiovascular: No pedal edema Respiratory: No cyanosis, no use of accessory musculature GI: abdomen soft Skin: No lesions in the area of chief complaint Neurologic: Sensation intact distally Psychiatric: Patient is competent for consent with normal mood and affect Lymphatic: no lymphedema  MUSCULOSKELETAL: exam stable  Assessment: left knee degenerative joint disease  Plan: Plan for Procedure(s): LEFT TOTAL KNEE ARTHROPLASTY  The risks benefits and alternatives were discussed with the patient including but not limited to the risks of nonoperative treatment, versus surgical intervention including infection, bleeding, nerve injury,  blood clots, cardiopulmonary complications, morbidity, mortality, among others, and they were willing to proceed.   Preoperative templating of the joint replacement has been completed, documented, and submitted to the Operating  Room personnel in order to optimize intra-operative equipment management.   Glee Arvin, MD 04/02/2021 9:03 AM

## 2021-04-02 NOTE — Evaluation (Signed)
Physical Therapy Evaluation Patient Details Name: Carlos Aguirre MRN: [id number] DOB: 1970-06-29 Today's Date: 04/02/2021  History of Present Illness  Pt is a 50 y.o. M who presents s/p left total knee arthroplasty 04/02/2021. No significant PMH.  Clinical Impression  PTA, pt lives with his spouse and works as a Production designer, theatre/television/film. Pt presents with expected left knee weakness and decreased ROM post op. Initiated home exercise program and written handout provided. Once sitting edge of bed, pt with nausea and vomiting. Still agreeable to transfer to the chair. Suspect excellent progress once nausea improves and with pain control/management. Will benefit from OPPT at follow up.     Recommendations for follow up therapy are one component of a multi-disciplinary discharge planning process, led by the attending physician.  Recommendations may be updated based on patient status, additional functional criteria and insurance authorization.  Follow Up Recommendations Follow physician's recommendations for discharge plan and follow up therapies    Assistance Recommended at Discharge PRN  Functional Status Assessment Patient has had a recent decline in their functional status and demonstrates the ability to make significant improvements in function in a reasonable and predictable amount of time.  Equipment Recommendations  None recommended by PT (pt has RW)    Recommendations for Other Services       Precautions / Restrictions Precautions Precautions: Fall Restrictions Weight Bearing Restrictions: No      Mobility  Bed Mobility Overal bed mobility: Needs Assistance Bed Mobility: Supine to Sit     Supine to sit: Min assist     General bed mobility comments: minA for LLE negotiation    Transfers Overall transfer level: Needs assistance Equipment used: Rolling walker (2 wheels) Transfers: Sit to/from Stand;Bed to chair/wheelchair/BSC Sit to Stand: Min guard   Step pivot transfers: Min guard        General transfer comment: cues for hand placement, keeping L foot flat, sequencing/direction for pivotal steps over to chair    Ambulation/Gait                  Stairs            Wheelchair Mobility    Modified Rankin (Stroke Patients Only)       Balance Overall balance assessment: Needs assistance Sitting-balance support: Feet supported Sitting balance-Leahy Scale: Good     Standing balance support: Bilateral upper extremity supported Standing balance-Leahy Scale: Poor                               Pertinent Vitals/Pain Pain Assessment: Faces Faces Pain Scale: Hurts even more Pain Location: L knee Pain Descriptors / Indicators: Grimacing;Operative site guarding Pain Intervention(s): Monitored during session;Limited activity within patient's tolerance    Home Living Family/patient expects to be discharged to:: Private residence Living Arrangements: Spouse/significant other;Children Available Help at Discharge: Family Type of Home: House Home Access: Stairs to enter   Secretary/administrator of Steps: 1   Home Layout: One level Home Equipment: Agricultural consultant (2 wheels)      Prior Function Prior Level of Function : Independent/Modified Independent;Working/employed (works as Production designer, theatre/television/film)                     Higher education careers adviser        Extremity/Trunk Assessment   Upper Extremity Assessment Upper Extremity Assessment: Overall WFL for tasks assessed    Lower Extremity Assessment Lower Extremity Assessment: LLE deficits/detail LLE Deficits / Details: S/p TKA.  Able to perform quad set, limited LAQ. Ankle WFL    Cervical / Trunk Assessment Cervical / Trunk Assessment: Normal  Communication   Communication: No difficulties  Cognition Arousal/Alertness: Awake/alert Behavior During Therapy: WFL for tasks assessed/performed Overall Cognitive Status: Within Functional Limits for tasks assessed                                           General Comments      Exercises Total Joint Exercises Ankle Circles/Pumps: Both;20 reps;Supine Quad Sets: Left;10 reps;Supine Hip ABduction/ADduction: AAROM;Left;5 reps;Supine Long Arc Quad: AAROM;Left;5 reps;Seated   Assessment/Plan    PT Assessment Patient needs continued PT services  PT Problem List Decreased strength;Decreased range of motion;Decreased activity tolerance;Decreased balance;Decreased mobility;Pain       PT Treatment Interventions DME instruction;Stair training;Gait training;Functional mobility training;Therapeutic activities;Therapeutic exercise;Balance training;Patient/family education    PT Goals (Current goals can be found in the Care Plan section)  Acute Rehab PT Goals Patient Stated Goal: get full range of motion back PT Goal Formulation: With patient Time For Goal Achievement: 04/16/21 Potential to Achieve Goals: Good    Frequency 7X/week   Barriers to discharge        Co-evaluation               AM-PAC PT "6 Clicks" Mobility  Outcome Measure Help needed turning from your back to your side while in a flat bed without using bedrails?: None Help needed moving from lying on your back to sitting on the side of a flat bed without using bedrails?: A Little Help needed moving to and from a bed to a chair (including a wheelchair)?: A Little Help needed standing up from a chair using your arms (e.g., wheelchair or bedside chair)?: A Little Help needed to walk in hospital room?: A Little Help needed climbing 3-5 steps with a railing? : A Lot 6 Click Score: 18    End of Session Equipment Utilized During Treatment: Gait belt Activity Tolerance: Other (comment) (limited by N/V) Patient left: in chair;with call bell/phone within reach;with family/visitor present Nurse Communication: Mobility status;Other (comment) (nausea meds) PT Visit Diagnosis: Difficulty in walking, not elsewhere classified (R26.2);Pain Pain - Right/Left:  Left Pain - part of body: Knee    Time: 4599-7741 PT Time Calculation (min) (ACUTE ONLY): 27 min   Charges:   PT Evaluation $PT Eval Low Complexity: 1 Low PT Treatments $Therapeutic Exercise: 8-22 mins        Lillia Pauls, PT, DPT Acute Rehabilitation Services Pager [phone] Office [phone]   Norval Morton 04/02/2021, 5:08 PM

## 2021-04-02 NOTE — Plan of Care (Signed)
NA

## 2021-04-02 NOTE — Anesthesia Preprocedure Evaluation (Addendum)
Anesthesia Evaluation  Patient identified by MRN, date of birth, ID band Patient awake    Reviewed: Allergy & Precautions, NPO status , Patient's Chart, lab work & pertinent test results  History of Anesthesia Complications Negative for: history of anesthetic complications  Airway Mallampati: II  TM Distance: >3 FB Neck ROM: Full    Dental  (+) Dental Advisory Given   Pulmonary neg pulmonary ROS,  03/29/2021 SARS coronavirus NEG   breath sounds clear to auscultation       Cardiovascular negative cardio ROS   Rhythm:Regular Rate:Normal     Neuro/Psych negative neurological ROS     GI/Hepatic negative GI ROS, Neg liver ROS,   Endo/Other  Morbid obesity  Renal/GU negative Renal ROS     Musculoskeletal  (+) Arthritis , Osteoarthritis,    Abdominal (+) + obese,   Peds  Hematology negative hematology ROS (+)   Anesthesia Other Findings   Reproductive/Obstetrics                            Anesthesia Physical Anesthesia Plan  ASA: 2  Anesthesia Plan: Regional and Spinal   Post-op Pain Management: Tylenol PO (pre-op)   Induction:   PONV Risk Score and Plan: 1 and Ondansetron and Dexamethasone  Airway Management Planned: Natural Airway and Simple Face Mask  Additional Equipment: None  Intra-op Plan:   Post-operative Plan:   Informed Consent: I have reviewed the patients History and Physical, chart, labs and discussed the procedure including the risks, benefits and alternatives for the proposed anesthesia with the patient or authorized representative who has indicated his/her understanding and acceptance.     Dental advisory given  Plan Discussed with: CRNA and Surgeon  Anesthesia Plan Comments: (Plan routine monitors, SAB with adductor canal block for post op analgesia)       Anesthesia Quick Evaluation

## 2021-04-02 NOTE — Transfer of Care (Signed)
Immediate Anesthesia Transfer of Care Note  Patient: Carlos Aguirre  Procedure(s) Performed: LEFT TOTAL KNEE ARTHROPLASTY (Left: Knee)  Patient Location: PACU  Anesthesia Type:Regional and Spinal  Level of Consciousness: awake, alert  and oriented  Airway & Oxygen Therapy: Patient Spontanous Breathing  Post-op Assessment: Report given to RN and Post -op Vital signs reviewed and stable  Post vital signs: Reviewed and stable  Last Vitals:  Vitals Value Taken Time  BP 114/74 04/02/21 1125  Temp    Pulse 79 04/02/21 1126  Resp 18 04/02/21 1126  SpO2 98 % 04/02/21 1126  Vitals shown include unvalidated device data.  Last Pain:  Vitals:   04/02/21 0807  TempSrc:   PainSc: 0-No pain         Complications: No notable events documented.

## 2021-04-02 NOTE — Anesthesia Procedure Notes (Signed)
Anesthesia Regional Block: Adductor canal block   Pre-Anesthetic Checklist: , timeout performed,  Correct Patient, Correct Site, Correct Laterality,  Correct Procedure, Correct Position, site marked,  Risks and benefits discussed,  Surgical consent,  Pre-op evaluation,  At surgeon's request and post-op pain management  Laterality: Left and Lower  Prep: chloraprep       Needles:  Injection technique: Single-shot  Needle Type: Echogenic Needle     Needle Length: 9cm  Needle Gauge: 21     Additional Needles:   Procedures:,,,, ultrasound used (permanent image in chart),,    Narrative:  Start time: 04/02/2021 8:24 AM End time: 04/02/2021 8:30 AM Injection made incrementally with aspirations every 5 mL.  Performed by: Personally  Anesthesiologist: Jairo Ben, MD  Additional Notes: Pt identified in Holding room.  Monitors applied. Working IV access confirmed. Sterile prep L thigh.  #21ga ECHOgenic Arrow block needle into adductor canal with US guidance.  20cc 0.75% Ropivacaine injected incrementally after negative test dose.  Patient asymptomatic, VSS, no heme aspirated, tolerated well.   Sandford Craze, MD

## 2021-04-02 NOTE — Discharge Instructions (Signed)

## 2021-04-02 NOTE — Op Note (Signed)
Total Knee Arthroplasty Procedure Note  Preoperative diagnosis: Left knee osteoarthritis  Postoperative diagnosis:same  Operative procedure: Left total knee arthroplasty. CPT [phone]  Surgeon: N. Glee Arvin, MD  Assist: Oneal Grout, PA-C; necessary for the timely completion of procedure and due to complexity of procedure.  Anesthesia: Spinal, regional  Tourniquet time: 47 minutes  Implants used: Zimmer persona pressfit Femur: CR 9 Tibia: F Patella: 35 mm Polyethylene: 14 mm, MC  Indication: Carlos Aguirre is a 50 y.o. year old male with a history of knee pain. Having failed conservative management, the patient elected to proceed with a total knee arthroplasty.  We have reviewed the risk and benefits of the surgery and they elected to proceed after voicing understanding.  Procedure:  After informed consent was obtained and understanding of the risk were voiced including but not limited to bleeding, infection, damage to surrounding structures including nerves and vessels, blood clots, leg length inequality and the failure to achieve desired results, the operative extremity was marked with verbal confirmation of the patient in the holding area.   The patient was then brought to the operating room and transported to the operating room table in the supine position.  A tourniquet was applied to the operative extremity around the upper thigh. The operative limb was then prepped and draped in the usual sterile fashion and preoperative antibiotics were administered.  A time out was performed prior to the start of surgery confirming the correct extremity, preoperative antibiotic administration, as well as team members, implants and instruments available for the case. Correct surgical site was also confirmed with preoperative radiographs. The limb was then elevated for exsanguination and the tourniquet was inflated. A midline incision was made and a standard medial parapatellar  approach was performed.  The patella was prepared and sized to a 35 mm.  A cover was placed on the patella for protection from retractors.  We then turned our attention to the femur. Posterior cruciate ligament was sacrificed. Start site was drilled in the femur and the intramedullary distal femoral cutting guide was placed, set at 5 degrees valgus, taking 10 mm of distal resection. The distal cut was made. Osteophytes were then removed.   Next, the proximal tibial cutting guide was placed with appropriate slope, varus/valgus alignment and depth of resection. The proximal tibial cut was made taking 4 mm off the lower medial side which had significant wear. Gap blocks were then used to assess the extension gap and alignment, and appropriate soft tissue releases were performed. Attention was turned back to the femur, which was sized using the sizing guide to a size 9. Appropriate rotation of the femoral component was determined using epicondylar axis, Whitesides line, and assessing the flexion gap under ligament tension. The appropriate size 4-in-1 cutting block was placed and cuts were made.  Posterior femoral osteophytes and uncapped bone were then removed with the curved osteotome.  Trial components were placed, and stability was checked in full extension, mid-flexion, and deep flexion. Proper tibial rotation was determined and marked.  The patella tracked well without a lateral release. Trial components were then removed and tibial preparation performed.  The tibia was sized for a size F component.  The bony surfaces were irrigated with a pulse lavage and then dried. The stability of the construct was re-evaluated throughout a range of motion and found to be acceptable. The trial liner was removed, the knee was copiously irrigated, and the knee was re-evaluated for any excess bone debris. The real  polyethylene liner, 14 mm thick, was inserted and checked to ensure the locking mechanism had engaged  appropriately. The tourniquet was deflated and hemostasis was achieved. The wound was irrigated with normal saline.  One gram of vancomycin powder was placed in the surgical bed.  Topical 0.25% bupivacaine and meloxicam was placed in the joint for postoperative pain.  Capsular closure was performed with a #1 vicryl, subcutaneous fat closed with a 0 vicryl suture, then subcutaneous tissue closed with interrupted 2.0 vicryl suture. The skin was then closed with a 3.0 monocryl. A sterile dressing was applied.  The patient was awakened in the operating room and taken to recovery in stable condition. All sponge, needle, and instrument counts were correct at the end of the case.  Tessa Lerner was necessary for opening, closing, retracting, limb positioning and overall facilitation and completion of the surgery.  Position: supine  Complications: none.  Time Out: performed   Drains/Packing: none  Estimated blood loss: minimal  Returned to Recovery Room: in good condition.   Antibiotics: yes   Mechanical VTE (DVT) Prophylaxis: sequential compression devices, TED thigh-high  Chemical VTE (DVT) Prophylaxis: aspirin  Fluid Replacement  Crystalloid: see anesthesia record Blood: none  FFP: none   Specimens Removed: 1 to pathology   Sponge and Instrument Count Correct? yes   PACU: portable radiograph - knee AP and Lateral   Plan/RTC: Return in 2 weeks for wound check.   Weight Bearing/Load Lower Extremity: full   Implant Name Type Inv. Item Serial No. Manufacturer Lot No. LRB No. Used Action  Persona trabecular femur Knees   ZIMMER KNEE [id number] Left 1 Implanted  STEM TIBIAL TRAB SZF LT - [id number] Stem STEM TIBIAL TRAB SZF LT  ZIMMER RECON(ORTH,TRAU,BIO,SG) [id number] Left 1 Implanted  COMP PATELLAR 10X35 METAL - [id number] Joint COMP PATELLAR 10X35 METAL  ZIMMER RECON(ORTH,TRAU,BIO,SG) [id number] Left 1 Implanted  INSERT ASF POLY 14 LT - [id number] Insert INSERT ASF POLY 14 LT  ZIMMER  RECON(ORTH,TRAU,BIO,SG) [id number] Left 1 Implanted    N. Glee Arvin, MD [facility] 10:42 AM

## 2021-04-02 NOTE — Anesthesia Procedure Notes (Signed)
Procedure Name: MAC Date/Time: 04/02/2021 9:15 AM Performed by: Trinna Post., CRNA Pre-anesthesia Checklist: Patient identified, Emergency Drugs available, Suction available, Patient being monitored and Timeout performed Patient Re-evaluated:Patient Re-evaluated prior to induction Oxygen Delivery Method: Simple face mask Preoxygenation: Pre-oxygenation with 100% oxygen Induction Type: IV induction Placement Confirmation: positive ETCO2

## 2021-04-02 NOTE — Anesthesia Procedure Notes (Signed)
Spinal  Patient location during procedure: OR End time: 04/02/2021 9:21 AM Staffing Performed: anesthesiologist  Anesthesiologist: Jairo Ben, MD Preanesthetic Checklist Completed: patient identified, IV checked, site marked, risks and benefits discussed, surgical consent, monitors and equipment checked, pre-op evaluation and timeout performed Spinal Block Patient position: sitting Prep: DuraPrep and site prepped and draped Patient monitoring: blood pressure, continuous pulse ox, cardiac monitor and heart rate Approach: midline Location: L3-4 Injection technique: single-shot Needle Needle type: Pencan and Introducer  Needle gauge: 24 G Needle length: 9 cm Assessment Events: CSF return Additional Notes Pt identified in Operating room.  Monitors applied. Working IV access confirmed. Sterile prep, drape lumbar spine.  1% lido local L 3,4.  #24ga Quincke into clear CSF L 3,4.  15mg  0.75% Bupivacaine with dextrose injected with asp CSF beginning and end of injection.  Patient asymptomatic, VSS, no heme aspirated, tolerated well.  , MD

## 2021-04-02 NOTE — Anesthesia Postprocedure Evaluation (Signed)
Anesthesia Post Note  Patient: Roddie Mc Whitworth  Procedure(s) Performed: LEFT TOTAL KNEE ARTHROPLASTY (Left: Knee)     Patient location during evaluation: PACU Anesthesia Type: Regional and Spinal Level of consciousness: awake and alert, patient cooperative and oriented Pain management: pain level controlled Vital Signs Assessment: post-procedure vital signs reviewed and stable Respiratory status: nonlabored ventilation, spontaneous breathing and respiratory function stable Cardiovascular status: blood pressure returned to baseline and stable Postop Assessment: no apparent nausea or vomiting, patient able to bend at knees and spinal receding Anesthetic complications: no   No notable events documented.  Last Vitals:  Vitals:   04/02/21 1240 04/02/21 1310  BP: 108/82 107/88  Pulse: 67 (!) 58  Resp: 18 12  Temp:    SpO2: 99% 93%    Last Pain:  Vitals:   04/02/21 1310  TempSrc:   PainSc: 0-No pain                 Buzz Axel,E. Osmond Steckman

## 2021-04-03 DIAGNOSIS — M1712 Unilateral primary osteoarthritis, left knee: Secondary | ICD-10-CM | POA: Diagnosis not present

## 2021-04-03 LAB — BASIC METABOLIC PANEL
Anion gap: 9 (ref 5–15)
BUN: 14 mg/dL (ref 6–20)
CO2: 25 mmol/L (ref 22–32)
Calcium: 8.8 mg/dL — ABNORMAL LOW (ref 8.9–10.3)
Chloride: 100 mmol/L (ref 98–111)
Creatinine, Ser: 1.05 mg/dL (ref 0.61–1.24)
GFR, Estimated: >60 mL/min (ref >60)
Glucose, Bld: 136 mg/dL — ABNORMAL HIGH (ref 70–99)
Potassium: 4.3 mmol/L (ref 3.5–5.1)
Sodium: 134 mmol/L — ABNORMAL LOW (ref 135–145)

## 2021-04-03 LAB — CBC
HCT: 41.1 % (ref 39.0–52.0)
Hemoglobin: 14.6 g/dL (ref 13.0–17.0)
MCH: 29.6 pg (ref 26.0–34.0)
MCHC: 35.5 g/dL (ref 30.0–36.0)
MCV: 83.2 fL (ref 80.0–100.0)
Platelets: 246 10*3/uL (ref 150–400)
RBC: 4.94 MIL/uL (ref 4.22–5.81)
RDW: 12.8 % (ref 11.5–15.5)
WBC: 13.4 10*3/uL — ABNORMAL HIGH (ref 4.0–10.5)
nRBC: 0 % (ref 0.0–0.2)

## 2021-04-03 NOTE — Discharge Summary (Signed)
Patient ID: TAO SATZ MRN: [id number] DOB/AGE: 1970-06-17 50 y.o.  Admit date: 04/02/2021 Discharge date: 04/03/2021  Admission Diagnoses:  Principal Problem:   Primary osteoarthritis of left knee Active Problems:   Status post total knee replacement, left   Discharge Diagnoses:  Same  Past Medical History:  Diagnosis Date   Complication of anesthesia    Hard to wake up per patient    Surgeries: Procedure(s): LEFT TOTAL KNEE ARTHROPLASTY on 04/02/2021   Consultants:   Discharged Condition: Improved  Hospital Course: MELFORD TULLIER is an 50 y.o. male who was admitted 04/02/2021 for operative treatment ofPrimary osteoarthritis of left knee. Patient has severe unremitting pain that affects sleep, daily activities, and work/hobbies. After pre-op clearance the patient was taken to the operating room on 04/02/2021 and underwent  Procedure(s): LEFT TOTAL KNEE ARTHROPLASTY.    Patient was given perioperative antibiotics:  Anti-infectives (From admission, onward)    Start     Dose/Rate Route Frequency Ordered Stop   04/02/21 1600  ceFAZolin (ANCEF) IVPB 2g/100 mL premix        2 g 200 mL/hr over 30 Minutes Intravenous Every 6 hours 04/02/21 1348 04/02/21 2242   04/02/21 0957  vancomycin (VANCOCIN) powder  Status:  Discontinued          As needed 04/02/21 0957 04/02/21 1118   04/02/21 0815  ceFAZolin (ANCEF) IVPB 2g/100 mL premix        2 g 200 mL/hr over 30 Minutes Intravenous On call to O.R. 04/02/21 0805 04/02/21 0955        Patient was given sequential compression devices, early ambulation, and chemoprophylaxis to prevent DVT.  Patient benefited maximally from hospital stay and there were no complications.    Recent vital signs: Patient Vitals for the past 24 hrs:  BP Temp Temp src Pulse Resp SpO2  04/03/21 0739 111/72 97.8 F (36.6 C) Oral 76 17 99 %  04/03/21 0406 137/77 97.6 F (36.4 C) Oral 71 18 99 %  04/03/21 0028 137/75 97.6 F (36.4 C) Oral 71 18 99  %  04/02/21 1957 131/82 98.7 F (37.1 C) Oral 92 20 97 %  04/02/21 1744 123/82 98 F (36.7 C) -- 79 17 100 %  04/02/21 1414 110/72 97.7 F (36.5 C) Oral 67 18 97 %  04/02/21 1340 104/73 (!) 97 F (36.1 C) -- (!) 59 20 97 %  04/02/21 1335 106/63 -- -- 61 15 94 %  04/02/21 1325 107/88 -- -- (!) 52 13 95 %  04/02/21 1310 107/88 -- -- (!) 58 12 93 %  04/02/21 1240 108/82 -- -- 67 18 99 %  04/02/21 1225 114/76 -- -- 65 12 96 %  04/02/21 1210 (!) 108/50 -- -- 70 20 100 %  04/02/21 1140 99/67 -- -- 72 17 99 %  04/02/21 1125 -- 97.7 F (36.5 C) -- -- -- --  04/02/21 0840 127/85 -- -- 67 12 96 %  04/02/21 0835 130/75 -- -- 80 16 93 %  04/02/21 0830 135/78 -- -- 80 13 98 %     Recent laboratory studies:  Recent Labs    04/03/21 0552  WBC 13.4*  HGB 14.6  HCT 41.1  PLT 246  NA 134*  K 4.3  CL 100  CO2 25  BUN 14  CREATININE 1.05  GLUCOSE 136*  CALCIUM 8.8*     Discharge Medications:   Allergies as of 04/03/2021       Reactions   Erythromycin Other (See  Comments)   Childhood allergy        Medication List     TAKE these medications    aspirin EC 81 MG tablet Take 1 tablet (81 mg total) by mouth 2 (two) times daily. To be taken after surgery to prevent blood clots   docusate sodium 100 MG capsule Commonly known as: Colace Take 1 capsule (100 mg total) by mouth daily as needed.   methocarbamol 500 MG tablet Commonly known as: Robaxin Take 1 tablet (500 mg total) by mouth 2 (two) times daily as needed. To be taken after surgery   ondansetron 4 MG tablet Commonly known as: Zofran Take 1 tablet (4 mg total) by mouth every 8 (eight) hours as needed for nausea or vomiting.   oxyCODONE-acetaminophen 5-325 MG tablet Commonly known as: Percocet Take 1-2 tablets by mouth every 6 (six) hours as needed. To be taken after surgery               Durable Medical Equipment  (From admission, onward)           Start     Ordered   04/02/21 1422  DME Walker  rolling  Once       Question Answer Comment  Walker: With 5 Inch Wheels   Patient needs a walker to treat with the following condition Status post left partial knee replacement      04/02/21 1421   04/02/21 1422  DME 3 n 1  Once        04/02/21 1421   04/02/21 1422  DME Bedside commode  Once       Question:  Patient needs a bedside commode to treat with the following condition  Answer:  Status post left partial knee replacement   04/02/21 1421            Diagnostic Studies: DG Knee Left Port  Result Date: 04/02/2021 CLINICAL DATA:  Postop left knee replacement EXAM: PORTABLE LEFT KNEE - 1-2 VIEW COMPARISON:  Radiograph dated February 02, 2021 FINDINGS: Status post left knee arthroplasty with intact hardware. No periprosthetic fracture. Subcutaneous emphysema as expected. IMPRESSION: Status post left knee arthroplasty without acute complications. Electronically Signed   By: Larose Hires D.O.   On: 04/02/2021 12:10    Disposition: Discharge disposition: 01-Home or Self Care          Follow-up Information     Tarry Kos, MD. Schedule an appointment as soon as possible for a visit in 2 week(s).   Specialty: Orthopedic Surgery Contact information: [address] [place] [address] [phone]                  Signed: Cristie Hem 04/03/2021, 8:23 AM

## 2021-04-03 NOTE — Evaluation (Signed)
Occupational Therapy Evaluation Patient Details Name: Carlos Aguirre MRN: [id number] DOB: 03-16-1971 Today's Date: 04/03/2021   History of Present Illness Pt is a 50 y.o. M who presents s/p left total knee arthroplasty 04/02/2021. No significant PMH.   Clinical Impression   Carlos Aguirre was indep in all ADL/IADLs and mobility PTA. He lives in a 1 level home, 1 STE with his wife who is able to assist as needed at d/c. Upon evaluation pt is limited by pain and decreased LLE ROM. He was able to complete ADLs and functional mobility with up to min guard A, and demonstrated great understanding of a sockaide and reacher AE to increased indep with LB ADLs. Reviewed compensatory techniques for safety and he verbalized understanding. Emphasized no pillow under L knee, and placed pillow under L ankle at the end of the session to promote extension. Pt does not required further acute OT. Recommend d/c to home with supervision initially for all ADLs and mobility.      Recommendations for follow up therapy are one component of a multi-disciplinary discharge planning process, led by the attending physician.  Recommendations may be updated based on patient status, additional functional criteria and insurance authorization.   Follow Up Recommendations  Follow physician's recommendations for discharge plan and follow up therapies    Assistance Recommended at Discharge Intermittent Supervision/Assistance  Functional Status Assessment  Patient has had a recent decline in their functional status and demonstrates the ability to make significant improvements in function in a reasonable and predictable amount of time.  Equipment Recommendations  BSC/3in1;Tub/shower bench (AE: reacher, sock aide)       Precautions / Restrictions Precautions Precautions: Fall Restrictions Weight Bearing Restrictions: No      Mobility Bed Mobility Overal bed mobility: Needs Assistance Bed Mobility: Supine to Sit     Supine to  sit: Supervision          Transfers Overall transfer level: Needs assistance Equipment used: Rolling walker (2 wheels) Transfers: Sit to/from Stand;Bed to chair/wheelchair/BSC Sit to Stand: Min guard           General transfer comment: verbal cues for placement of LLE      Balance Overall balance assessment: Needs assistance Sitting-balance support: Feet supported Sitting balance-Leahy Scale: Good     Standing balance support: Single extremity supported;During functional activity Standing balance-Leahy Scale: Fair Standing balance comment: in static standing                           ADL either performed or assessed with clinical judgement   ADL Overall ADL's : Needs assistance/impaired Eating/Feeding: Independent;Sitting   Grooming: Supervision/safety;Standing   Upper Body Bathing: Set up;Sitting   Lower Body Bathing: Min guard;Sit to/from stand   Upper Body Dressing : Set up   Lower Body Dressing: Supervision/safety;Sit to/from stand   Toilet Transfer: Nurse, adult (2 wheels)   Toileting- Clothing Manipulation and Hygiene: Supervision/safety;Sitting/lateral lean       Functional mobility during ADLs: Min guard;Rolling walker (2 wheels) General ADL Comments: educated on use of AE for lower body dressing, pt demo'ed great understanding of reacher and sockaide.     Vision Baseline Vision/History: 0 No visual deficits Ability to See in Adequate Light: 0 Adequate Patient Visual Report: No change from baseline Vision Assessment?: No apparent visual deficits     Perception     Praxis      Pertinent Vitals/Pain Pain Assessment: Faces Faces Pain Scale: Hurts a little  bit Pain Location: L knee Pain Descriptors / Indicators: Grimacing;Operative site guarding Pain Intervention(s): Limited activity within patient's tolerance;Monitored during session     Hand Dominance     Extremity/Trunk Assessment Upper Extremity  Assessment Upper Extremity Assessment: Overall WFL for tasks assessed   Lower Extremity Assessment Lower Extremity Assessment: Defer to PT evaluation   Cervical / Trunk Assessment Cervical / Trunk Assessment: Normal   Communication Communication Communication: No difficulties   Cognition Arousal/Alertness: Awake/alert Behavior During Therapy: WFL for tasks assessed/performed Overall Cognitive Status: Within Functional Limits for tasks assessed                            Home Living Family/patient expects to be discharged to:: Private residence Living Arrangements: Spouse/significant other;Children Available Help at Discharge: Family Type of Home: House Home Access: Stairs to enter Secretary/administrator of Steps: 1   Home Layout: One level     Bathroom Shower/Tub: Tub/shower unit         Home Equipment: Agricultural consultant (2 wheels)          Prior Functioning/Environment Prior Level of Function : Independent/Modified Independent;Working/employed                        OT Problem List: Decreased strength;Decreased range of motion;Decreased activity tolerance;Impaired balance (sitting and/or standing);Decreased safety awareness;Decreased knowledge of precautions;Decreased knowledge of use of DME or AE;Pain      OT Treatment/Interventions:      OT Goals(Current goals can be found in the care plan section) Acute Rehab OT Goals Patient Stated Goal: hoem soon OT Goal Formulation: With patient         AM-PAC OT "6 Clicks" Daily Activity     Outcome Measure Help from another person eating meals?: None Help from another person taking care of personal grooming?: A Little Help from another person toileting, which includes using toliet, bedpan, or urinal?: A Little Help from another person bathing (including washing, rinsing, drying)?: A Little Help from another person to put on and taking off regular upper body clothing?: None Help from another person  to put on and taking off regular lower body clothing?: A Little 6 Click Score: 20   End of Session Equipment Utilized During Treatment: Rolling walker (2 wheels) Nurse Communication: Mobility status  Activity Tolerance: Patient tolerated treatment well Patient left: in bed;with call bell/phone within reach  OT Visit Diagnosis: Unsteadiness on feet (R26.81);Other abnormalities of gait and mobility (R26.89);Muscle weakness (generalized) (M62.81);Pain                Time: 9826-4158 OT Time Calculation (min): 26 min Charges:  OT General Charges $OT Visit: 1 Visit OT Evaluation $OT Eval Moderate Complexity: 1 Mod OT Treatments $Self Care/Home Management : 8-22 mins   Joshual Terrio A Nahun Kronberg 04/03/2021, 9:19 AM

## 2021-04-03 NOTE — Plan of Care (Signed)
ERROR

## 2021-04-03 NOTE — Progress Notes (Signed)
Physical Therapy Treatment Patient Details Name: Carlos Aguirre MRN: [id number] DOB: 1970-06-26 Today's Date: 04/03/2021   History of Present Illness Pt is a 50 y.o. M who presents s/p left total knee arthroplasty 04/02/2021. No significant PMH.    PT Comments    Pt tolerates treatment well, utilizing walker for transfer, ambulation, and stair negotiation without PT assistance requirements. PT provides reinforcement for HEP and knee for maintaining L knee straight for majority of the day but allowing for brief 5-10 minute periods to work on knee flexion. Pt will benefit from continued reinforcement of HEP to improve knee ROM.   Recommendations for follow up therapy are one component of a multi-disciplinary discharge planning process, led by the attending physician.  Recommendations may be updated based on patient status, additional functional criteria and insurance authorization.  Follow Up Recommendations  Follow physician's recommendations for discharge plan and follow up therapies     Assistance Recommended at Discharge PRN  Equipment Recommendations  None recommended by PT (pt owns a RW)    Recommendations for Other Services       Precautions / Restrictions Precautions Precautions: Fall;Knee Precaution Booklet Issued: No Precaution Comments: verbally reviewed knee precautions Restrictions Weight Bearing Restrictions: No     Mobility  Bed Mobility Overal bed mobility: Modified Independent Bed Mobility: Supine to Sit     Supine to sit: Modified independent (Device/Increase time)          Transfers Overall transfer level: Modified independent Equipment used: Rolling walker (2 wheels) Transfers: Sit to/from Stand Sit to Stand: Modified independent (Device/Increase time)           General transfer comment: verbal cues for placement of LLE    Ambulation/Gait Ambulation/Gait assistance: Modified independent (Device/Increase time) Gait Distance (Feet): 250  Feet Assistive device: Rolling walker (2 wheels) Gait Pattern/deviations: Step-through pattern Gait velocity: functional Gait velocity interpretation: 1.31 - 2.62 ft/sec, indicative of limited community ambulator   General Gait Details: pt with steady step-through gait   Stairs Stairs: Yes Stairs assistance: Modified independent (Device/Increase time) Stair Management: No rails;Backwards;With walker Number of Stairs: 1     Wheelchair Mobility    Modified Rankin (Stroke Patients Only)       Balance Overall balance assessment: Modified Independent Sitting-balance support: No upper extremity supported;Feet supported Sitting balance-Leahy Scale: Good     Standing balance support: Single extremity supported;No upper extremity supported;During functional activity Standing balance-Leahy Scale: Good Standing balance comment: in static standing                            Cognition Arousal/Alertness: Awake/alert Behavior During Therapy: WFL for tasks assessed/performed Overall Cognitive Status: Within Functional Limits for tasks assessed                                          Exercises Total Joint Exercises Goniometric ROM: knee flexion AROM 52. PROM 67. Knee extension AROM -9, PROM -8.    General Comments General comments (skin integrity, edema, etc.): VSS on RA      Pertinent Vitals/Pain Pain Assessment: 0-10 Pain Score: 7  Faces Pain Scale: Hurts a little bit Pain Location: L knee Pain Descriptors / Indicators: Sore Pain Intervention(s): Monitored during session    Home Living Family/patient expects to be discharged to:: Private residence Living Arrangements: Spouse/significant other;Children Available Help at Discharge: Family Type  of Home: House Home Access: Stairs to enter   [employer] of Steps: 1   Home Layout: One level Home Equipment: Agricultural consultant (2 wheels)      Prior Function            PT Goals  (current goals can now be found in the care plan section) Acute Rehab PT Goals Patient Stated Goal: get full range of motion back Progress towards PT goals: Progressing toward goals    Frequency    7X/week      PT Plan Current plan remains appropriate    Co-evaluation              AM-PAC PT "6 Clicks" Mobility   Outcome Measure  Help needed turning from your back to your side while in a flat bed without using bedrails?: None Help needed moving from lying on your back to sitting on the side of a flat bed without using bedrails?: None Help needed moving to and from a bed to a chair (including a wheelchair)?: None Help needed standing up from a chair using your arms (e.g., wheelchair or bedside chair)?: None Help needed to walk in hospital room?: None Help needed climbing 3-5 steps with a railing? : None 6 Click Score: 24    End of Session   Activity Tolerance: Patient tolerated treatment well Patient left: in chair;with call bell/phone within reach Nurse Communication: Mobility status PT Visit Diagnosis: Difficulty in walking, not elsewhere classified (R26.2);Pain Pain - Right/Left: Left Pain - part of body: Knee     Time: 6237-6283 PT Time Calculation (min) (ACUTE ONLY): 32 min  Charges:  $Gait Training: 23-37 mins                     Arlyss Gandy, PT, DPT Acute Rehabilitation Pager: [phone] Office [phone]    Arlyss Gandy 04/03/2021, 11:13 AM

## 2021-04-03 NOTE — Progress Notes (Signed)
Subjective: 1 Day Post-Op Procedure(s) (LRB): LEFT TOTAL KNEE ARTHROPLASTY (Left) Patient reports pain as mild.    Objective: Vital signs in last 24 hours: Temp:  [97 F (36.1 C)-98.7 F (37.1 C)] 97.8 F (36.6 C) (12/20 0739) Pulse Rate:  [52-92] 76 (12/20 0739) Resp:  [12-20] 17 (12/20 0739) BP: (99-137)/(50-88) 111/72 (12/20 0739) SpO2:  [93 %-100 %] 99 % (12/20 0739)  Intake/Output from previous day: 12/19 0701 - 12/20 0700 In: 1000 [I.V.:800; IV Piggyback:200] Out: 1225 [Urine:1200; Blood:25] Intake/Output this shift: No intake/output data recorded.  Recent Labs    04/03/21 0552  HGB 14.6   Recent Labs    04/03/21 0552  WBC 13.4*  RBC 4.94  HCT 41.1  PLT 246   Recent Labs    04/03/21 0552  NA 134*  K 4.3  CL 100  CO2 25  BUN 14  CREATININE 1.05  GLUCOSE 136*  CALCIUM 8.8*   No results for input(s): LABPT, INR in the last 72 hours.  Neurologically intact Neurovascular intact Sensation intact distally Intact pulses distally Dorsiflexion/Plantar flexion intact Incision: dressing C/D/I No cellulitis present Compartment soft   Assessment/Plan: 1 Day Post-Op Procedure(s) (LRB): LEFT TOTAL KNEE ARTHROPLASTY (Left) Advance diet Up with therapy D/C IV fluids Discharge home with home health after first or second PT session depending on progression WBAT LLE   Anticipated LOS equal to or greater than 2 midnights due to - Age 25 and older with one or more of the following:  - Obesity  - Expected need for hospital services (PT, OT, Nursing) required for safe  discharge  - Anticipated need for postoperative skilled nursing care or inpatient rehab  - Active co-morbidities: None OR   - Unanticipated findings during/Post Surgery: None  - Patient is a high risk of re-admission due to: None   Cristie Hem 04/03/2021, 8:22 AM

## 2021-04-04 ENCOUNTER — Encounter (HOSPITAL_COMMUNITY): Payer: Self-pay | Admitting: Orthopaedic Surgery

## 2021-04-05 ENCOUNTER — Telehealth: Payer: Self-pay

## 2021-04-05 NOTE — Telephone Encounter (Signed)
Called patient no answer LMOM.   Just need to know if the hosp set him up with HHPT. Dorene Sorrow from [facility] states they are unable to see him due to them being booked.

## 2021-04-10 ENCOUNTER — Other Ambulatory Visit: Payer: Self-pay | Admitting: Physician Assistant

## 2021-04-10 ENCOUNTER — Telehealth: Payer: Self-pay | Admitting: Orthopaedic Surgery

## 2021-04-10 MED ORDER — OXYCODONE-ACETAMINOPHEN 5-325 MG PO TABS: 1.0000 | ORAL_TABLET | Freq: Four times a day (QID) | ORAL | 0 refills | Status: DC | PRN

## 2021-04-10 NOTE — Telephone Encounter (Signed)
I called patient and advised. Call cannot go through on wife's phone.

## 2021-04-10 NOTE — Telephone Encounter (Signed)
Patient's wife called. Says he needs a refill on oxycodone. Her call back number is [phone]

## 2021-04-10 NOTE — Telephone Encounter (Signed)
Sent in

## 2021-04-18 ENCOUNTER — Ambulatory Visit (INDEPENDENT_AMBULATORY_CARE_PROVIDER_SITE_OTHER): Payer: PRIVATE HEALTH INSURANCE | Admitting: Orthopaedic Surgery

## 2021-04-18 ENCOUNTER — Other Ambulatory Visit: Payer: Self-pay

## 2021-04-18 VITALS — BP 125/90 | HR 110

## 2021-04-18 DIAGNOSIS — Z96652 Presence of left artificial knee joint: Secondary | ICD-10-CM

## 2021-04-18 MED ORDER — OXYCODONE-ACETAMINOPHEN 5-325 MG PO TABS: 1.0000 | ORAL_TABLET | ORAL | 0 refills | Status: DC | PRN

## 2021-04-18 MED ORDER — KETOROLAC TROMETHAMINE 10 MG PO TABS: 10.0000 mg | ORAL_TABLET | Freq: Two times a day (BID) | ORAL | 0 refills | Status: AC | PRN

## 2021-04-18 MED ORDER — METHOCARBAMOL 500 MG PO TABS: 500.0000 mg | ORAL_TABLET | Freq: Two times a day (BID) | ORAL | 0 refills | Status: DC | PRN

## 2021-04-18 NOTE — Progress Notes (Signed)
° °  Post-Op Visit Note   Patient: Carlos Aguirre           Date of Birth: [birth year]           MRN: [id number] Visit Date: 04/18/2021 PCP: Pcp, No   Assessment & Plan:  Chief Complaint:  Chief Complaint  Patient presents with   Left Knee - Pain, Routine Post Op   Visit Diagnoses:  1. Hx of total knee replacement, left     Plan: Patient is a pleasant 51 year old gentleman who comes in today 2 weeks status post left total knee replacement, date of surgery 04/02/2021.  He has been using a CPM but has not received home health physical therapy.  He has been taking Percocet for pain.  He denies any calf pain, chest pain but does note breathing heavily at times due to the pain.  Examination of the left knee reveals a fully healed surgical scar without complication.  Calf is soft and nontender.  He is neurovascular intact distally.  At this point, Steri-Strips were applied.  We will start him in outpatient physical therapy and a referral has been made.  If he does not hear from anyone within the next week he will call us and let us know.  I have refilled his Percocet and Robaxin and will add Toradol.  Dental prophylaxis reinforced.  Follow-up with [place] in 4 weeks time for repeat evaluation and 2 view x-rays of the left knee.  Call with concerns or questions in meantime.  Follow-Up Instructions: Return in about 4 weeks (around 05/16/2021).   Orders:  No orders of the defined types were placed in this encounter.  No orders of the defined types were placed in this encounter.   Imaging: No new imaging  PMFS History: Patient Active Problem List   Diagnosis Date Noted   Status post total knee replacement, left 04/02/2021   Primary osteoarthritis of left knee 02/02/2021   Past Medical History:  Diagnosis Date   Complication of anesthesia    Hard to wake up per patient    No family history on file.  Past Surgical History:  Procedure Laterality Date   HERNIA REPAIR     KNEE SURGERY      SHOULDER SURGERY     TOTAL KNEE ARTHROPLASTY Left 04/02/2021   Procedure: LEFT TOTAL KNEE ARTHROPLASTY;  Surgeon: Leandrew Koyanagi, MD;  Location: [facility];  Service: Orthopedics;  Laterality: Left;   Social History   Occupational History   Not on file  Tobacco Use   Smoking status: Never   Smokeless tobacco: Not on file  Vaping Use   Vaping Use: Never used  Substance and Sexual Activity   Alcohol use: No   Drug use: No   Sexual activity: Not on file

## 2021-04-26 NOTE — Therapy (Signed)
OUTPATIENT PHYSICAL THERAPY LOWER EXTREMITY EVALUATION   Patient Name: Carlos Aguirre MRN: [id number] DOB:[birth year], 51 y.o., male Today's Date: 04/30/2021   PT End of Session - 04/30/21 1448     Visit Number 1    Number of Visits 17    Date for PT Re-Evaluation 06/25/21    Authorization Type [employer]    PT Start Time 1445    PT Stop Time 1530    PT Time Calculation (min) 45 min    Activity Tolerance Patient tolerated treatment well    Behavior During Therapy WFL for tasks assessed/performed             Past Medical History:  Diagnosis Date   Complication of anesthesia    Hard to wake up per patient   Past Surgical History:  Procedure Laterality Date   HERNIA REPAIR     KNEE SURGERY     SHOULDER SURGERY     TOTAL KNEE ARTHROPLASTY Left 04/02/2021   Procedure: LEFT TOTAL KNEE ARTHROPLASTY;  Surgeon: Tarry Kos, MD;  Location: [facility];  Service: Orthopedics;  Laterality: Left;   Patient Active Problem List   Diagnosis Date Noted   Status post total knee replacement, left 04/02/2021   Primary osteoarthritis of left knee 02/02/2021    PCP: Pcp, No  REFERRING PROVIDER: Cristie Hem, PA-C  REFERRING DIAG: Hx of total knee replacement, left  THERAPY DIAG:  Chronic pain of left knee  Muscle weakness (generalized)  Other abnormalities of gait and mobility  ONSET DATE: 04/02/2021  SUBJECTIVE:  SUBJECTIVE STATEMENT: Patient reports left TKA on 04/02/2021, he has not had any therapy thus far, he was given a CPM to use but has not had it the past few weeks. He consistently ices and does walking around the house. His knee mainly feels stiff. Patient reports that prior to surgery he did have significant limitations in knee motion.  PERTINENT HISTORY: None  PAIN:  Are you having pain? Yes NPRS scale: 7/10 Pain location: Knee Pain orientation: Left  PAIN TYPE: Surgical, Chronic Pain description: Constant and uncomfortable  Aggravating factors:  Walking, standing, bending, sitting too long Relieving factors: ice, OTC meds  PRECAUTIONS: None  WEIGHT BEARING RESTRICTIONS No  FALLS:  Has patient fallen in last 6 months? 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  PLOF: Independent  PATIENT GOALS Pain relief and get back to work without limitation   OBJECTIVE:  DIAGNOSTIC FINDINGS: N/A  PATIENT SURVEYS:  FOTO 53% functional status  COGNITION: Overall cognitive status: Within functional limits for tasks assessed     SENSATION:  Light touch: Appears intact  EDEMA: Patient demonstrates increased swelling of left knee, not formally measured this visit  MUSCLE LENGTH:  Hamstring and quad limitation  PALPATION: Generalized tenderness to palpation of left knee, mainly lateral aspect  LE AROM/PROM:  AROM Right 04/30/2021 Left 04/30/2021  Knee flexion  60  Knee extension 0 -15   PROM Right 04/30/2021 Left 04/30/2021  Knee flexion  65  Knee extension       -12   LE MMT:  MMT Right 04/30/2021 Left 04/30/2021  Hip flexion 4 4  Hip extension 4- 4-  Hip abduction 4- 4-  Knee flexion 5 4-  Knee extension 5 4-    Patient demonstrates extension lag with  SLR  FUNCTIONAL TESTS:  Not assessed  GAIT: Assistive device utilized: Single point cane Level of assistance: Modified independence Comments: Antalgic on the right, reduced heel strike and toe off   TODAY'S TREATMENT: Supine heel slide with strap 5 x 10 sec Quad set towel roll under knee 10 x 5 sec SLR x 5 Seated knee flexion stretch with good leg 5 x 10 sec LAQ x 10 Seated hamstring stretch 3 x 30 sec Seated heel slide x 10 Standing gastroc stretch 2 x 30 sec   PATIENT EDUCATION:  Education details: Exam findings, POC, HEP Person educated:  Patient Education method: Explanation, Demonstration, Tactile cues, Verbal cues, and Handouts Education comprehension: verbalized understanding, returned demonstration, verbal cues required, tactile cues required, and needs further education  HOME EXERCISE PROGRAM: Access Code: [email or web address]   ASSESSMENT:CLINICAL IMPRESSION: Patient is a 51 y.o. male who was seen today for physical therapy evaluation and treatment for left TKA on 04/02/2021. Objective impairments include Abnormal gait, decreased activity tolerance, decreased balance, difficulty walking, decreased ROM, decreased strength, and pain. These impairments are limiting patient from cleaning, community activity, driving, meal prep, occupation, yard work, and shopping. Personal factors including Past/current experiences and Time since onset of injury/illness/exacerbation are also affecting patient's functional outcome. Patient will benefit from skilled PT to address above impairments and improve overall function.  REHAB POTENTIAL: Good  CLINICAL DECISION MAKING: Stable/uncomplicated  EVALUATION COMPLEXITY: Low   GOALS: Goals reviewed with patient? Yes  SHORT TERM GOALS:  STG Name Target Date Goal status  1 Patient will be I with initial HEP in order to progress with therapy. Baseline: provided at eval 05/28/2021 INITIAL  2 PT will review FOTO with patient by 3rd visit in order to understand expected progress and outcome with therapy. Baseline: assessed at eval 05/28/2021 INITIAL  3 Patient will demonstrate active knee motion 5-90 deg in order to improve gait Baseline: 15-60 deg 05/28/2021 INITIAL  4 Patient will ambulate community level without AD to improve access and return to work without limitations Baseline: patient ambulating using [facility] 05/28/2021 INITIAL   LONG TERM GOALS:   LTG Name Target Date Goal status  1 Patient will be I with final HEP to maintain progress from PT. Baseline: provided at eval 06/25/2021 INITIAL  2  Patient will report >/= 66% status on FOTO to indicate improved functional ability. Baseline: 53% 06/25/2021 INITIAL  3 Patient will demonstrate active knee motion 2-120 deg to all normal movement mechanics with transfers and stairs Baseline: 15-60 deg at eval 06/25/2021 INITIAL  4 Patient will exhibit knee strength 5/5 MMT to improve standing tolerance reduce pain with activity Baseline: grossly 4-/5 in available range, extension lag with SLR 06/25/2021 INITIAL  5 Patient will report pain </= 2/10 with all standing and walking activity to maximize functional ability and allow full return to work Baseline: 7/10 06/25/2021 INITIAL    PLAN: PT FREQUENCY: 2x/week  PT DURATION: 8 weeks  PLANNED INTERVENTIONS: Therapeutic exercises, Therapeutic activity, Neuro Muscular re-education, Balance training, Gait training, Patient/Family education, Joint mobilization, Stair training, Aquatic Therapy, Dry Needling, Cryotherapy, Moist heat, Taping, Vasopneumatic device, and Manual therapy  PLAN FOR NEXT SESSION: Review HEP and progress PRN, manual/stretching for knee flexion and extension, progress strength to closed chain as tolerated, balance and gait training   Rosana Hoes, PT, DPT, LAT, ATC 05/01/21  8:05 AM Phone: [phone] Fax: [phone]

## 2021-04-30 ENCOUNTER — Other Ambulatory Visit: Payer: Self-pay

## 2021-04-30 ENCOUNTER — Ambulatory Visit: Payer: PRIVATE HEALTH INSURANCE | Attending: Physician Assistant | Admitting: Physical Therapy

## 2021-04-30 ENCOUNTER — Encounter: Payer: Self-pay | Admitting: Physical Therapy

## 2021-04-30 DIAGNOSIS — Z96652 Presence of left artificial knee joint: Secondary | ICD-10-CM | POA: Insufficient documentation

## 2021-04-30 DIAGNOSIS — M25562 Pain in left knee: Secondary | ICD-10-CM | POA: Diagnosis present

## 2021-04-30 DIAGNOSIS — G8929 Other chronic pain: Secondary | ICD-10-CM | POA: Diagnosis present

## 2021-04-30 DIAGNOSIS — M6281 Muscle weakness (generalized): Secondary | ICD-10-CM | POA: Diagnosis present

## 2021-04-30 DIAGNOSIS — R600 Localized edema: Secondary | ICD-10-CM | POA: Insufficient documentation

## 2021-04-30 DIAGNOSIS — R2689 Other abnormalities of gait and mobility: Secondary | ICD-10-CM | POA: Diagnosis present

## 2021-04-30 NOTE — Patient Instructions (Signed)
Access Code: [email or web address] URL: [email or web address] Date: 04/30/2021 Prepared by: Rosana Hoes  Exercises Supine Heel Slide with Strap - 2-3 x daily - 7 x weekly - 10 reps - 10 hold Supine Quad Set - 2-3 x daily - 7 x weekly - 10 reps - 5 seconds hold Active Straight Leg Raise with Quad Set - 2-3 x daily - 7 x weekly - 2 sets - 5 reps Seated Knee Flexion AAROM - 1 x daily - 7 x weekly - 10 reps - 10 seconds hold Seated Long Arc Quad - 2-3 x daily - 7 x weekly - 2 sets - 5 reps - 5 seconds hold Seated Hamstring Stretch - 2-3 x daily - 7 x weekly - 3 reps - 30 seconds hold Seated Heel Slide - 2-3 x daily - 7 x weekly - 10 reps Standing Gastroc Stretch at Counter - 2-3 x daily - 7 x weekly - 3 reps - 30 seconds hold

## 2021-05-03 ENCOUNTER — Ambulatory Visit: Payer: PRIVATE HEALTH INSURANCE

## 2021-05-03 ENCOUNTER — Encounter (INDEPENDENT_AMBULATORY_CARE_PROVIDER_SITE_OTHER): Payer: Self-pay

## 2021-05-03 ENCOUNTER — Other Ambulatory Visit: Payer: Self-pay

## 2021-05-03 DIAGNOSIS — M6281 Muscle weakness (generalized): Secondary | ICD-10-CM

## 2021-05-03 DIAGNOSIS — M25562 Pain in left knee: Secondary | ICD-10-CM | POA: Diagnosis not present

## 2021-05-03 DIAGNOSIS — R600 Localized edema: Secondary | ICD-10-CM

## 2021-05-03 DIAGNOSIS — G8929 Other chronic pain: Secondary | ICD-10-CM

## 2021-05-03 DIAGNOSIS — R2689 Other abnormalities of gait and mobility: Secondary | ICD-10-CM

## 2021-05-03 NOTE — Therapy (Signed)
OUTPATIENT PHYSICAL THERAPY TREATMENT NOTE   Patient Name: Carlos Aguirre MRN: [id number] DOB:1971/04/12, 51 y.o., male Today's Date: 05/03/2021  PCP: Oneita HurtPcp, No REFERRING PROVIDER: Cristie HemStanbery, Mary L, PA-C   PT End of Session - 05/03/21 1642     Visit Number 2    Number of Visits 17    Date for PT Re-Evaluation 06/25/21    Authorization Type [employer]IC [employer]    PT Start Time 1630    PT Stop Time 1715    PT Time Calculation (min) 45 min    Equipment Utilized During Treatment --   SPC   Activity Tolerance Patient tolerated treatment well    Behavior During Therapy WFL for tasks assessed/performed             Past Medical History:  Diagnosis Date   Complication of anesthesia    Hard to wake up per patient   Past Surgical History:  Procedure Laterality Date   HERNIA REPAIR     KNEE SURGERY     SHOULDER SURGERY     TOTAL KNEE ARTHROPLASTY Left 04/02/2021   Procedure: LEFT TOTAL KNEE ARTHROPLASTY;  Surgeon: Tarry KosXu, Naiping M, MD;  Location: [facility] OR;  Service: Orthopedics;  Laterality: Left;   Patient Active Problem List   Diagnosis Date Noted   Status post total knee replacement, left 04/02/2021   Primary osteoarthritis of left knee 02/02/2021    REFERRING DIAG: Hx of total knee replacement, left  THERAPY DIAG:  Chronic pain of left knee  Muscle weakness (generalized)  Other abnormalities of gait and mobility  Localized edema  PERTINENT HISTORY: None  PRECAUTIONS: None  SUBJECTIVE: Pt reports he has been completing his HEP 3x daily   PAIN:  Are you having pain? Yes NPRS scale: 2/10, 8/10 end range of flexion and extension Pain location: knee Pain orientation: Left  PAIN TYPE: aching Pain description: intermittent  Aggravating factors: end range of flexion and extension Relieving factors: cold pack, rest    PATIENT GOALS Pain relief and get back to work without limitation     OBJECTIVE:  DIAGNOSTIC FINDINGS: N/A   PATIENT SURVEYS:  FOTO 53%  functional status   COGNITION: Overall cognitive status: Within functional limits for tasks assessed                        SENSATION:          Light touch: Appears intact   EDEMA: Patient demonstrates increased swelling of left knee, not formally measured this visit   MUSCLE LENGTH:  Hamstring and quad limitation   PALPATION: Generalized tenderness to palpation of left knee, mainly lateral aspect   LE AROM/PROM:   AROM Right 04/30/2021 Left 04/30/2021  Knee flexion   60  Knee extension 0 -15    PROM Right 04/30/2021 Left 04/30/2021  Knee flexion   65  Knee extension        -12    LE MMT:   MMT Right 04/30/2021 Left 04/30/2021  Hip flexion 4 4  Hip extension 4- 4-  Hip abduction 4- 4-  Knee flexion 5 4-  Knee extension 5 4-             Patient demonstrates extension lag with SLR   FUNCTIONAL TESTS:  Not assessed   GAIT: Assistive device utilized: Single point cane Level of assistance: Modified independence Comments: Antalgic on the right, reduced heel strike and toe off  [facility] Adult PT Treatment:  DATE: 05/03/21 Therapeutic Exercise: Nu-Step 5 mins, L3, UE/LE  Quad set x10, 5" Mat table L hamstring/knee ext stretch, x5, 60" LAQ x10, 5" Seated L knee flexion stretch, x5, 60"  Manual Therapy: Patellar grade lll and lV mobs all directions. AP and PA tib/fem grad lll mobs.  Therapeutic Activity: Gait training for a heel to toe gait pattern      Self Care: Updated HEP with stretching exs  AROM    Pre-session 16-60d. Post session 13-70d     TODAY'S TREATMENT: Eval 04/30/21 Supine heel slide with strap 5 x 10 sec Quad set towel roll under knee 10 x 5 sec SLR x 5 Seated knee flexion stretch with good leg 5 x 10 sec LAQ x 10 Seated hamstring stretch 3 x 30 sec Seated heel slide x 10 Standing gastroc stretch 2 x 30 sec     PATIENT EDUCATION:  Education details: Exam findings, POC, HEP Person educated:  Patient Education method: Explanation, Demonstration, Tactile cues, Verbal cues, and Handouts Education comprehension: verbalized understanding, returned demonstration, verbal cues required, tactile cues required, and needs further education   HOME EXERCISE PROGRAM: Access Code: [email or web address] URL: [email or web address] Date: 05/03/2021 Prepared by: Joellyn Rued  Exercises Supine Heel Slide with Strap - 2-3 x daily - 7 x weekly - 10 reps - 10 hold Supine Quad Set - 2-3 x daily - 7 x weekly - 10 reps - 5 seconds hold Active Straight Leg Raise with Quad Set - 2-3 x daily - 7 x weekly - 2 sets - 5 reps Seated Knee Flexion AAROM - 1 x daily - 7 x weekly - 10 reps - 10 seconds hold Seated Long Arc Quad - 2-3 x daily - 7 x weekly - 2 sets - 5 reps - 5 seconds hold Seated Hamstring Stretch - 2-3 x daily - 7 x weekly - 3 reps - 30 seconds hold Seated Heel Slide - 2-3 x daily - 7 x weekly - 10 reps Standing Gastroc Stretch at Counter - 2-3 x daily - 7 x weekly - 3 reps - 30 seconds hold Seated Table Hamstring Stretch - 1 x daily - 7 x weekly - 3 sets - 60 reps      ASSESSMENT:CLINICAL IMPRESSION: PT was completed to address L knee ROM per mobilizations and prolonged therex stretches. Pt reports L knee pain during the stretches, but without significant at the completion of the stretch nor at the end of the PT session. Pt declined a cold pack, stating he will use one when he gets home. Additionally, gait training for heel to toe pattern was completed with the pt demonstrating an improved gait pattern c a SPC. PT was tolerated today without adverse effects. Pt will continue to benefit from skilled PT to address deficits for optimize function of the L LE.   REHAB POTENTIAL: Good   CLINICAL DECISION MAKING: Stable/uncomplicated   EVALUATION COMPLEXITY: Low     GOALS: Goals reviewed with patient? Yes   SHORT TERM GOALS:   STG Name Target Date Goal status  1 Patient will be I with  initial HEP in order to progress with therapy. Baseline: provided at eval 05/28/2021 INITIAL  2 PT will review FOTO with patient by 3rd visit in order to understand expected progress and outcome with therapy. Baseline: assessed at eval 05/28/2021 INITIAL  3 Patient will demonstrate active knee motion 5-90 deg in order to improve gait Baseline: 15-60 deg 05/28/2021 INITIAL  4 Patient will ambulate community level without AD  to improve access and return to work without limitations Baseline: patient ambulating using [facility] 05/28/2021 INITIAL    LONG TERM GOALS:    LTG Name Target Date Goal status  1 Patient will be I with final HEP to maintain progress from PT. Baseline: provided at eval 06/25/2021 INITIAL  2 Patient will report >/= 66% status on FOTO to indicate improved functional ability. Baseline: 53% 06/25/2021 INITIAL  3 Patient will demonstrate active knee motion 2-120 deg to all normal movement mechanics with transfers and stairs Baseline: 15-60 deg at eval 06/25/2021 INITIAL  4 Patient will exhibit knee strength 5/5 MMT to improve standing tolerance reduce pain with activity Baseline: grossly 4-/5 in available range, extension lag with SLR 06/25/2021 INITIAL  5 Patient will report pain </= 2/10 with all standing and walking activity to maximize functional ability and allow full return to work Baseline: 7/10 06/25/2021 INITIAL      PLAN: PT FREQUENCY: 2x/week   PT DURATION: 8 weeks   PLANNED INTERVENTIONS: Therapeutic exercises, Therapeutic activity, Neuro Muscular re-education, Balance training, Gait training, Patient/Family education, Joint mobilization, Stair training, Aquatic Therapy, Dry Needling, Cryotherapy, Moist heat, Taping, Vasopneumatic device, and Manual therapy   PLAN FOR NEXT SESSION: Review HEP and progress PRN, manual/stretching for knee flexion and extension, progress strength to closed chain as tolerated, balance and gait training. Assess response to new knee stretches.       Argyle Gustafson MS, PT 05/03/21 6:08 PM

## 2021-05-07 NOTE — Therapy (Signed)
OUTPATIENT PHYSICAL THERAPY TREATMENT NOTE   Patient Name: Carlos Aguirre MRN: [id number] DOB:10-27-1970, 51 y.o., male Today's Date: 05/08/2021  PCP: Oneita Hurt, No REFERRING PROVIDER: Cristie Hem, PA-C   PT End of Session - 05/08/21 1531     Visit Number 3    Number of Visits 17    Date for PT Re-Evaluation 06/25/21    Authorization Type GENERIC [employer]ERCIAL    PT Start Time 1530    PT Stop Time 1615    PT Time Calculation (min) 45 min    Activity Tolerance Patient tolerated treatment well    Behavior During Therapy WFL for tasks assessed/performed              Past Medical History:  Diagnosis Date   Complication of anesthesia    Hard to wake up per patient   Past Surgical History:  Procedure Laterality Date   HERNIA REPAIR     KNEE SURGERY     SHOULDER SURGERY     TOTAL KNEE ARTHROPLASTY Left 04/02/2021   Procedure: LEFT TOTAL KNEE ARTHROPLASTY;  Surgeon: Tarry Kos, MD;  Location: [facility] OR;  Service: Orthopedics;  Laterality: Left;   Patient Active Problem List   Diagnosis Date Noted   Status post total knee replacement, left 04/02/2021   Primary osteoarthritis of left knee 02/02/2021    REFERRING DIAG: Hx of total knee replacement, left  THERAPY DIAG:  Chronic pain of left knee  Muscle weakness (generalized)  Other abnormalities of gait and mobility  Localized edema  PERTINENT HISTORY: None  PRECAUTIONS: None  SUBJECTIVE: Patient reports he feels he is doing better, he can tighten up his thigh muscle much better so is no longer using the cane. He has had a few occasions where his knee wanted to buckle. Patient also states he doesn't have any pain while riding in the car anymore.  PAIN:  Are you having pain? Yes NPRS scale: 1/10 Pain location: Knee Pain orientation: Left  PAIN TYPE: dull aching Pain description: intermittent  Aggravating factors: end range of flexion and extension Relieving factors: cold pack, rest    PATIENT GOALS Pain  relief and get back to work without limitation     OBJECTIVE: (BOLDED MEASURE ASSESSED THIS VISIT) PATIENT SURVEYS:  FOTO 53% functional status   LE AROM/PROM:   AROM Right 04/30/2021 Left 04/30/2021 Left 05/03/2021 Left 05/08/2021  Knee flexion   60 70 74  Knee extension 0 -15 -13 -9    PROM Right 04/30/2021 Left 04/30/2021  Knee flexion   65  Knee extension        -12    LE MMT:   MMT Right 04/30/2021 Left 04/30/2021  Hip flexion 4 4  Hip extension 4- 4-  Hip abduction 4- 4-  Knee flexion 5 4-  Knee extension 5 4-    Patient continues to demonstrate extension lag with SLR   TODAY'S TREATMENT:  05/08/2021: Therapeutic Exercise: NuStep L5 x 5 min with UE/LE while taking subjective  Slant board calf stretch 3 x 30 sec Seated hamstring stretch 3 x 30 sec Modified thomas stretch 3 x 30 sec with manual overpressure Heel slide with manual overpressure at end ranges x 5 Quad set 10 x 5 sec hold SLR 2 x 5 - focus on quad control to avoid extension lag SAQ with lift 2 x 8 Standing TKE with green 10 x 5 sec hold Squat at FM bar x 10 - focus on full range, holding at end range  knee flexion Manual Therapy: Seated and supine tib-fem mobs to improve knee flexion and extension Seated and supine knee PROM for flexion and extension Supine patellofemoral mobs all directions Proximal scar massage to improve mobility   05/03/21 Therapeutic Exercise: Nu-Step 5 mins, L3, UE/LE  Quad set x10, 5" Mat table L hamstring/knee ext stretch, x5, 60" LAQ x10, 5" Seated L knee flexion stretch, x5, 60" Manual Therapy: Patellar grade lll and lV mobs all directions. AP and PA tib/fem grad lll mobs. Therapeutic Activity: Gait training for a heel to toe gait pattern  Self Care: Updated HEP with stretching exs  04/30/21 (eval) Supine heel slide with strap 5 x 10 sec Quad set towel roll under knee 10 x 5 sec SLR x 5 Seated knee flexion stretch with good leg 5 x 10 sec LAQ x 10 Seated  hamstring stretch 3 x 30 sec Seated heel slide x 10 Standing gastroc stretch 2 x 30 sec     PATIENT EDUCATION:  Education details: HEP Person educated: Patient Education method: Programmer, multimediaxplanation, Demonstration, ActorTactile cues, Verbal cues Education comprehension: verbalized understanding, returned demonstration, verbal cues required, tactile cues required, and needs further education   HOME EXERCISE PROGRAM: Access Code: [email or web address]     ASSESSMENT: CLINICAL IMPRESSION: Patient tolerated therapy well with no adverse effects. Therapy focused on improving knee motion and progressing quad strength and control. Patient does demonstrate improvement in knee motion this visit but still limited overall. He continues to exhibit extension lag with SLR but able to progress to standing quad strengthening. Patient seems to be doing well with pain control. No changes made to HEP this visit. Patient would benefit from continued skilled PT to progress mobility and strength in order to reduce pain and maximize functional ability.     GOALS: Goals reviewed with patient? Yes   SHORT TERM GOALS:   STG Name Target Date Goal status  1 Patient will be I with initial HEP in order to progress with therapy. Baseline: provided at eval 05/28/2021 INITIAL  2 PT will review FOTO with patient by 3rd visit in order to understand expected progress and outcome with therapy. Baseline: assessed at eval 05/28/2021 INITIAL  3 Patient will demonstrate active knee motion 5-90 deg in order to improve gait Baseline: 15-60 deg 05/28/2021 INITIAL  4 Patient will ambulate community level without AD to improve access and return to work without limitations [facility] 05/28/[facility] INITIAL    LONG TERM GOALS:    LTG Name Target Date Goal status  1 Patient will be I with final HEP to maintain progress from PT. Baseline: provided at eval 06/25/2021 INITIAL  2 Patient will report >/= 66% status on FOTO to indicate  improved functional ability. Baseline: 53% 06/25/2021 INITIAL  3 Patient will demonstrate active knee motion 2-120 deg to all normal movement mechanics with transfers and stairs Baseline: 15-60 deg at eval 06/25/2021 INITIAL  4 Patient will exhibit knee strength 5/5 MMT to improve standing tolerance reduce pain with activity Baseline: grossly 4-/5 in available range, extension lag with SLR 06/25/2021 INITIAL  5 Patient will report pain </= 2/10 with all standing and walking activity to maximize functional ability and allow full return to work Baseline: 7/10 06/25/2021 INITIAL      PLAN: PT FREQUENCY: 2x/week   PT DURATION: 8 weeks   PLANNED INTERVENTIONS: Therapeutic exercises, Therapeutic activity, Neuro Muscular re-education, Balance training, Gait training, Patient/Family education, Joint mobilization, Stair training, Aquatic Therapy, Dry Needling, Cryotherapy, Moist heat, Taping, Vasopneumatic  device, and Manual therapy   PLAN FOR NEXT SESSION: Review HEP and progress PRN, manual/stretching for knee flexion and extension, progress strength to closed chain as tolerated, balance and gait training. Assess response to new knee stretches.     Rosana Hoes, PT, DPT, LAT, ATC 05/08/21  4:36 PM Phone: [phone] Fax: [phone]

## 2021-05-08 ENCOUNTER — Encounter: Payer: Self-pay | Admitting: Physical Therapy

## 2021-05-08 ENCOUNTER — Other Ambulatory Visit: Payer: Self-pay

## 2021-05-08 ENCOUNTER — Ambulatory Visit: Payer: PRIVATE HEALTH INSURANCE | Admitting: Physical Therapy

## 2021-05-08 DIAGNOSIS — R2689 Other abnormalities of gait and mobility: Secondary | ICD-10-CM

## 2021-05-08 DIAGNOSIS — R600 Localized edema: Secondary | ICD-10-CM

## 2021-05-08 DIAGNOSIS — M6281 Muscle weakness (generalized): Secondary | ICD-10-CM

## 2021-05-08 DIAGNOSIS — M25562 Pain in left knee: Secondary | ICD-10-CM

## 2021-05-10 ENCOUNTER — Other Ambulatory Visit: Payer: Self-pay

## 2021-05-10 ENCOUNTER — Ambulatory Visit: Payer: PRIVATE HEALTH INSURANCE

## 2021-05-10 DIAGNOSIS — R600 Localized edema: Secondary | ICD-10-CM

## 2021-05-10 DIAGNOSIS — G8929 Other chronic pain: Secondary | ICD-10-CM

## 2021-05-10 DIAGNOSIS — M25562 Pain in left knee: Secondary | ICD-10-CM | POA: Diagnosis not present

## 2021-05-10 DIAGNOSIS — M6281 Muscle weakness (generalized): Secondary | ICD-10-CM

## 2021-05-10 DIAGNOSIS — R2689 Other abnormalities of gait and mobility: Secondary | ICD-10-CM

## 2021-05-10 NOTE — Therapy (Signed)
OUTPATIENT PHYSICAL THERAPY TREATMENT NOTE   Patient Name: Carlos Aguirre MRN: [id number] DOB:02-21-1971, 51 y.o., male Today's Date: 05/10/2021  PCP: Oneita Hurt, No REFERRING PROVIDER: Cristie Hem, PA-C   PT End of Session - 05/10/21 1426     Visit Number 4    Number of Visits 17    Date for PT Re-Evaluation 06/25/21    Authorization Type GENERIC [employer]ERCIAL    Progress Note Due on Visit 10    PT Start Time 1330    PT Stop Time 1415    PT Time Calculation (min) 45 min    Activity Tolerance Patient tolerated treatment well    Behavior During Therapy WFL for tasks assessed/performed               Past Medical History:  Diagnosis Date   Complication of anesthesia    Hard to wake up per patient   Past Surgical History:  Procedure Laterality Date   HERNIA REPAIR     KNEE SURGERY     SHOULDER SURGERY     TOTAL KNEE ARTHROPLASTY Left 04/02/2021   Procedure: LEFT TOTAL KNEE ARTHROPLASTY;  Surgeon: Tarry Kos, MD;  Location: [facility] OR;  Service: Orthopedics;  Laterality: Left;   Patient Active Problem List   Diagnosis Date Noted   Status post total knee replacement, left 04/02/2021   Primary osteoarthritis of left knee 02/02/2021    REFERRING DIAG: Hx of total knee replacement, left  THERAPY DIAG:  Chronic pain of left knee  Muscle weakness (generalized)  Other abnormalities of gait and mobility  Localized edema  PERTINENT HISTORY: None  PRECAUTIONS: None  SUBJECTIVE: Pt reports he is using a SPC occasionally after periods of sitting or sleeping when his knee is very stiff otherwise he is walking without assistance.  Patient reports he feels he is doing better, he can tighten up his thigh muscle much better so is no longer using the cane. He has had a few occasions where his knee wanted to buckle. Patient also states he doesn't have any pain while riding in the car anymore.  PAIN:  Are you having pain? Yes NPRS scale: 1/10 Pain location: Knee Pain  orientation: Left  PAIN TYPE: dull aching Pain description: intermittent  Aggravating factors: end range of flexion and extension Relieving factors: cold pack, rest    PATIENT GOALS Pain relief and get back to work without limitation     OBJECTIVE: (BOLDED MEASURE ASSESSED THIS VISIT) PATIENT SURVEYS:  FOTO 53% functional status   LE AROM/PROM:   AROM Right 04/30/2021 Left 04/30/2021 Left 05/03/2021 Left 05/08/2021 Left 05/10/2021  Knee flexion   60 70 74 75  Knee extension 0 -15 -13 -9 -10    PROM Right 04/30/2021 Left 04/30/2021  Knee flexion   65  Knee extension        -12    LE MMT:   MMT Right 04/30/2021 Left 04/30/2021  Hip flexion 4 4  Hip extension 4- 4-  Hip abduction 4- 4-  Knee flexion 5 4-  Knee extension 5 4-    Patient continues to demonstrate extension lag with SLR  [facility] Adult PT Treatment:                                                DATE: 05/10/21  Therapeutic Exercise: NuStep L5 x 5 min with  UE/LE while taking subjective. Seat was moved forward after 2.5 mins for increased knee flexion. Slant board calf stretch 3 x 30 sec Long seat hamstring stretch 3 x 30 sec Modified thomas stretch 3 x 30 sec with manual overpressure Heel slide with manual overpressure at end ranges x 5 Quad set 10 x 5 sec hold SLR 2 x 5 - focus on quad control to avoid extension lag SAQ with lift 2 x 8 Squat at FM bar x 10 - focus on full range, holding at end range knee flexion AROM for knee flexion with L foot on 8" box, x5, 20" Manual Therapy: Supine tib-fem mobs to improve knee flexion and extension Seated knee contract/relax stretching for flexion Supine patellofemoral mobs all directions Self care:  Discussed and demonstrated prone hangs to address knee ext. ROM. Pt returned demonstration   TODAY'S TREATMENT:  05/08/2021: Therapeutic Exercise: NuStep L5 x 5 min with UE/LE while taking subjective  Slant board calf stretch 3 x 30 sec Seated hamstring stretch 3 x 30  sec Modified thomas stretch 3 x 30 sec with manual overpressure Heel slide with manual overpressure at end ranges x 5 Quad set 10 x 5 sec hold SLR 2 x 5 - focus on quad control to avoid extension lag SAQ with lift 2 x 8 Standing TKE with green 10 x 5 sec hold Squat at FM bar x 10 - focus on full range, holding at end range knee flexion AROM for knee flexion Manual Therapy: Seated and supine tib-fem mobs to improve knee flexion and extension Seated and supine knee PROM for flexion and extension Supine patellofemoral mobs all directions Proximal scar massage to improve mobility   05/03/21 Therapeutic Exercise: Nu-Step 5 mins, L3, UE/LE  Quad set x10, 5" Mat table L hamstring/knee ext stretch, x5, 60" LAQ x10, 5" Seated L knee flexion stretch, x5, 60" Manual Therapy: Patellar grade lll and lV mobs all directions. AP and PA tib/fem grad lll mobs. Therapeutic Activity: Gait training for a heel to toe gait pattern  Self Care: Updated HEP with stretching exs  04/30/21 (eval) Supine heel slide with strap 5 x 10 sec Quad set towel roll under knee 10 x 5 sec SLR x 5 Seated knee flexion stretch with good leg 5 x 10 sec LAQ x 10 Seated hamstring stretch 3 x 30 sec Seated heel slide x 10 Standing gastroc stretch 2 x 30 sec     PATIENT EDUCATION:  Education details: HEP Person educated: Patient Education method: Programmer, multimedia, Facilities manager, Actor cues, Verbal cues Education comprehension: verbalized understanding, returned demonstration, verbal cues required, tactile cues required, and needs further education   HOME EXERCISE PROGRAM: Access Code: [email or web address]     ASSESSMENT:  CLINICAL IMPRESSION: PT was completed with primary focus on increasing L knee flexion/ext ROM per mobilizations, contract/relax stretching, and therex. PT was also completed for quad strengthening. Pt continues to demonstrate an extension lag c SLR. During the end range of stretching and ROM exs, pt reports an  increase in pain that reduces to a low level when the stretch is discontinued. AROM for L knee increased from the pre treatment AROM of 65d to 75d, and end of session knee ext AROM = 10d. Overall, pt is making progress with L knee AROM. Anticipate rehab to be slower than usual due to PT not being initially completed following surgery and the chronicity of the ROM/strength limitations. Pt tolerated today's session without adverse effects.     GOALS: Goals reviewed with patient?  Yes   SHORT TERM GOALS:   STG Name Target Date Goal status  1 Patient will be I with initial HEP in order to progress with therapy. Baseline: provided at eval 05/28/2021 INITIAL  2 PT will review FOTO with patient by 3rd visit in order to understand expected progress and outcome with therapy. Baseline: assessed at eval 05/28/2021 INITIAL  3 Patient will demonstrate active knee motion 5-90 deg in order to improve gait Baseline: 15-60 deg 05/28/2021 INITIAL  4 Patient will ambulate community level without AD to improve access and return to work without limitations [facility] INITIAL    LONG TERM GOALS:    LTG Name Target Date Goal status  1 Patient will be I with final HEP to maintain progress from PT. Baseline: provided at eval 06/25/2021 INITIAL  2 Patient will report >/= 66% status on FOTO to indicate improved functional ability. Baseline: 53% 06/25/2021 INITIAL  3 Patient will demonstrate active knee motion 2-120 deg to all normal movement mechanics with transfers and stairs Baseline: 15-60 deg at eval 06/25/2021 INITIAL  4 Patient will exhibit knee strength 5/5 MMT to improve standing tolerance reduce pain with activity Baseline: grossly 4-/5 in available range, extension lag with SLR 06/25/2021 INITIAL  5 Patient will report pain </= 2/10 with all standing and walking activity to maximize functional ability and allow full return to work Baseline: 7/10 06/25/2021 INITIAL       PLAN: PT FREQUENCY: 2x/week   PT DURATION: 8 weeks   PLANNED INTERVENTIONS: Therapeutic exercises, Therapeutic activity, Neuro Muscular re-education, Balance training, Gait training, Patient/Family education, Joint mobilization, Stair training, Aquatic Therapy, Dry Needling, Cryotherapy, Moist heat, Taping, Vasopneumatic device, and Manual therapy   PLAN FOR NEXT SESSION: Review HEP and progress PRN, manual/stretching for knee flexion and extension, progress strength to closed chain as tolerated, balance and gait training. Assess response to new knee stretches.     Ardean Simonich MS, PT 05/10/21 5:08 PM

## 2021-05-15 ENCOUNTER — Other Ambulatory Visit: Payer: Self-pay

## 2021-05-15 ENCOUNTER — Encounter: Payer: Self-pay | Admitting: Physical Therapy

## 2021-05-15 ENCOUNTER — Ambulatory Visit: Payer: PRIVATE HEALTH INSURANCE | Admitting: Physical Therapy

## 2021-05-15 DIAGNOSIS — M25562 Pain in left knee: Secondary | ICD-10-CM | POA: Diagnosis not present

## 2021-05-15 DIAGNOSIS — G8929 Other chronic pain: Secondary | ICD-10-CM

## 2021-05-15 DIAGNOSIS — R2689 Other abnormalities of gait and mobility: Secondary | ICD-10-CM

## 2021-05-15 DIAGNOSIS — R600 Localized edema: Secondary | ICD-10-CM

## 2021-05-15 DIAGNOSIS — M6281 Muscle weakness (generalized): Secondary | ICD-10-CM

## 2021-05-15 NOTE — Therapy (Signed)
OUTPATIENT PHYSICAL THERAPY TREATMENT NOTE   Patient Name: Carlos Aguirre MRN: [id number] DOB:Jul 14, 1970, 51 y.o., male Today's Date: 05/15/2021  PCP: Oneita Hurt, No REFERRING PROVIDER: Cristie Hem, PA-C   PT End of Session - 05/15/21 1536     Visit Number 5    Number of Visits 17    Date for PT Re-Evaluation 06/25/21    Authorization Type [employer]    Progress Note Due on Visit 10    PT Start Time 1530    PT Stop Time 1615    PT Time Calculation (min) 45 min    Activity Tolerance Patient tolerated treatment well    Behavior During Therapy WFL for tasks assessed/performed                Past Medical History:  Diagnosis Date   Complication of anesthesia    Hard to wake up per patient   Past Surgical History:  Procedure Laterality Date   HERNIA REPAIR     KNEE SURGERY     SHOULDER SURGERY     TOTAL KNEE ARTHROPLASTY Left 04/02/2021   Procedure: LEFT TOTAL KNEE ARTHROPLASTY;  Surgeon: Tarry Kos, MD;  Location: [facility] OR;  Service: Orthopedics;  Laterality: Left;   Patient Active Problem List   Diagnosis Date Noted   Status post total knee replacement, left 04/02/2021   Primary osteoarthritis of left knee 02/02/2021    REFERRING DIAG: Hx of total knee replacement, left  THERAPY DIAG:  Chronic pain of left knee  Muscle weakness (generalized)  Other abnormalities of gait and mobility  Localized edema  PERTINENT HISTORY: None  PRECAUTIONS: None   SUBJECTIVE: Patient reports he is stiff today from the weather, is having some pulling on the back and outside of the knee,  PAIN:  Are you having pain? Yes NPRS scale: 1/10 Pain location: Knee Pain orientation: Left  PAIN TYPE: Dull aching Pain description: intermittent  Aggravating factors: end range of flexion and extension Relieving factors: cold pack, rest    PATIENT GOALS Pain relief and get back to work without limitation     OBJECTIVE: (BOLDED MEASURE ASSESSED THIS VISIT) PATIENT  SURVEYS:  FOTO 53% functional status   LE AROM/PROM:   AROM Left 04/30/2021 Left 05/03/2021 Left 05/08/2021 Left 05/10/2021  05/15/2021  Knee flexion 60 70 74 75 70  Knee extension -15 -13 -9 -10 -8    PROM Right 04/30/2021 Left 04/30/2021  Knee flexion   65  Knee extension        -12    LE MMT:   MMT Right 04/30/2021 Left 04/30/2021  Hip flexion 4 4  Hip extension 4- 4-  Hip abduction 4- 4-  Knee flexion 5 4-  Knee extension 5 4-    Patient able to perform SLR without extension lag this visit   TODAY'S TREATMENT 05/15/2021: Therapeutic Exercise: NuStep L5 x 5 min with UE/LE while taking subjective  Heel prop LLLD stretch with 5# and MHP to posterior knee x 5 min Calf stretch at wall 3 x 30 sec Seated hamstring stretch 3 x 30 sec Quad set 2 x 10 with 5 sec hold SLR 2 x 10 Modified thomas stretch 3 x 30 sec with manual overpressure Heel slide with manual overpressure at end ranges x 5 Squat at FM bar x 10 - focus on full range, holding at end range knee flexion Manual Therapy: Seated and supine tib-fem mobs to improve knee flexion and extension Seated and supine knee PROM for  flexion and extension, tibial IR applied with knee flexion stretch Supine patellofemoral mobs all directions Proximal scar massage to improve mobility   05/10/21 Therapeutic Exercise: NuStep L5 x 5 min with UE/LE while taking subjective. Seat was moved forward after 2.5 mins for increased knee flexion. Slant board calf stretch 3 x 30 sec Long seat hamstring stretch 3 x 30 sec Modified thomas stretch 3 x 30 sec with manual overpressure Heel slide with manual overpressure at end ranges x 5 Quad set 10 x 5 sec hold SLR 2 x 5 - focus on quad control to avoid extension lag SAQ with lift 2 x 8 Squat at FM bar x 10 - focus on full range, holding at end range knee flexion AROM for knee flexion with L foot on 8" box, x5, 20" Manual Therapy: Supine tib-fem mobs to improve knee flexion and  extension Seated knee contract/relax stretching for flexion Supine patellofemoral mobs all directions Self care:  Discussed and demonstrated prone hangs to address knee ext. ROM. Pt returned demonstration   05/08/2021: Therapeutic Exercise: NuStep L5 x 5 min with UE/LE while taking subjective  Slant board calf stretch 3 x 30 sec Seated hamstring stretch 3 x 30 sec Modified thomas stretch 3 x 30 sec with manual overpressure Heel slide with manual overpressure at end ranges x 5 Quad set 10 x 5 sec hold SLR 2 x 5 - focus on quad control to avoid extension lag SAQ with lift 2 x 8 Standing TKE with green 10 x 5 sec hold Squat at FM bar x 10 - focus on full range, holding at end range knee flexion AROM for knee flexion Manual Therapy: Seated and supine tib-fem mobs to improve knee flexion and extension Seated and supine knee PROM for flexion and extension Supine patellofemoral mobs all directions Proximal scar massage to improve mobility   PATIENT EDUCATION:  Education details: HEP Person educated: Patient Education method: Programmer, multimedia, Facilities manager, Actor cues, Verbal cues Education comprehension: verbalized understanding, returned demonstration, verbal cues required, tactile cues required, and needs further education   HOME EXERCISE PROGRAM: Access Code: [email or web address]     ASSESSMENT: CLINICAL IMPRESSION: Patient tolerated therapy well with no adverse effects. Therapy continues to focus primarily on improving knee motion. Overall he continues to demonstrate limitation if knee flexion and extension, but able to exhibit improvement in extension this visit. Patient also was able to perform SLR exercise without an extension lag this visit indicating improvement in quad control. No changes made to HEP this visit, patient encouraged to continue stretching and possibly try heel prop with weight over knee for long duration stretch into knee extension. He is likely to have slower progress with  knee motion given limitations prior to surgery but patient is making progress toward long term goals. Patient would benefit from continued skilled PT to progress mobility and strength in order to reduce pain and maximize functional ability.     GOALS: Goals reviewed with patient? Yes   SHORT TERM GOALS:   STG Name Target Date Goal status  1 Patient will be I with initial HEP in order to progress with therapy. Baseline: provided at eval 05/28/2021 INITIAL  2 PT will review FOTO with patient by 3rd visit in order to understand expected progress and outcome with therapy. Baseline: assessed at eval 05/28/2021 INITIAL  3 Patient will demonstrate active knee motion 5-90 deg in order to improve gait Baseline: 15-60 deg 05/28/2021 INITIAL  4 Patient will ambulate community level without AD to improve access  and return to work without limitations Baseline: patient ambulating using SPC 05/28/2021 INITIAL    [facility]:    LTG Name Target Date Goal status  1 Patient will be I with final HEP to maintain progress from PT. Baseline: provided at eval 06/25/2021 INITIAL  2 Patient will report >/= 66% status on FOTO to indicate improved functional ability. Baseline: 53% 06/25/2021 INITIAL  3 Patient will demonstrate active knee motion 2-120 deg to all normal movement mechanics with transfers and stairs Baseline: 15-60 deg at eval 06/25/2021 INITIAL  4 Patient will exhibit knee strength 5/5 MMT to improve standing tolerance reduce pain with activity Baseline: grossly 4-/5 in available range, extension lag with SLR 06/25/2021 INITIAL  5 Patient will report pain </= 2/10 with all standing and walking activity to maximize functional ability and allow full return to work Baseline: 7/10 06/25/2021 INITIAL      PLAN: PT FREQUENCY: 2x/week   PT DURATION: 8 weeks   PLANNED INTERVENTIONS: Therapeutic exercises, Therapeutic activity, Neuro Muscular re-education, Balance training, Gait training, Patient/Family  education, Joint mobilization, Stair training, Aquatic Therapy, Dry Needling, Cryotherapy, Moist heat, Taping, Vasopneumatic device, and Manual therapy   PLAN FOR NEXT SESSION: Review HEP and progress PRN, manual/stretching for knee flexion and extension, progress strength to closed chain as tolerated, balance and gait training. Assess response to new knee stretches.     Rosana Hoesampbell Tan Clopper, PT, DPT, LAT, ATC 05/15/21  4:17 PM Phone: [phone] Fax: [phone]

## 2021-05-16 ENCOUNTER — Ambulatory Visit (INDEPENDENT_AMBULATORY_CARE_PROVIDER_SITE_OTHER): Payer: PRIVATE HEALTH INSURANCE

## 2021-05-16 ENCOUNTER — Encounter: Payer: Self-pay | Admitting: Orthopaedic Surgery

## 2021-05-16 ENCOUNTER — Ambulatory Visit (INDEPENDENT_AMBULATORY_CARE_PROVIDER_SITE_OTHER): Payer: PRIVATE HEALTH INSURANCE | Admitting: Orthopaedic Surgery

## 2021-05-16 DIAGNOSIS — Z96652 Presence of left artificial knee joint: Secondary | ICD-10-CM | POA: Diagnosis not present

## 2021-05-16 DIAGNOSIS — M24662 Ankylosis, left knee: Secondary | ICD-10-CM

## 2021-05-16 NOTE — Progress Notes (Signed)
° °  Post-Op Visit Note   Patient: Carlos Aguirre           Date of Birth: 08-16-1970           MRN: [id number] Visit Date: 05/16/2021 PCP: Pcp, No   Assessment & Plan:  Chief Complaint:  Chief Complaint  Patient presents with   Left Knee - Pain   Visit Diagnoses:  1. Hx of total knee replacement, left   2. Arthrofibrosis of knee joint, left   3. Status post total knee replacement, left     Plan: Carlos Aguirre is 6 weeks status post left total knee replacement press-fit.  He takes Percocet when he is doing exercises and physical therapy.  He is doing PT twice a week.  He does feel that he is still quite stiff.  Examination left knee shows a fully healed surgical scar.  His range of motion is 8 to 70 degrees.  Stable to varus valgus.  Mild swelling.  No signs of infection.  X-rays are unremarkable  Based on his lack of progress in range of motion I have recommended a manipulation in the next couple weeks.  He states that he is not really functionally limited by the lack of extension but the flexion definitely prevents him from doing a lot of ADLs.  He agrees to undergo the manipulation.  Based on his job description I feel that it is fine for him to go back to work without restrictions.  He may need to take a day or 2 off after the manipulation but for the most part he should be fine.  I will have Debbie call the patient to get that scheduled.  Follow-Up Instructions: No follow-ups on file.   Orders:  Orders Placed This Encounter  Procedures   XR Knee 1-2 Views Left   No orders of the defined types were placed in this encounter.   Imaging: XR Knee 1-2 Views Left  Result Date: 05/16/2021 Stable total knee replacement in good alignment.    PMFS History: Patient Active Problem List   Diagnosis Date Noted   Arthrofibrosis of knee joint, left 05/16/2021   Status post total knee replacement, left 04/02/2021   Primary osteoarthritis of left knee 02/02/2021   Past Medical History:   Diagnosis Date   Complication of anesthesia    Hard to wake up per patient    History reviewed. No pertinent family history.  Past Surgical History:  Procedure Laterality Date   HERNIA REPAIR     KNEE SURGERY     SHOULDER SURGERY     TOTAL KNEE ARTHROPLASTY Left 04/02/2021   Procedure: LEFT TOTAL KNEE ARTHROPLASTY;  Surgeon: Tarry Kos, MD;  Location: [facility] OR;  Service: Orthopedics;  Laterality: Left;   Social History   Occupational History   Not on file  Tobacco Use   Smoking status: Never   Smokeless tobacco: Not on file  Vaping Use   Vaping Use: Never used  Substance and Sexual Activity   Alcohol use: No   Drug use: No   Sexual activity: Not on file

## 2021-05-17 ENCOUNTER — Other Ambulatory Visit: Payer: Self-pay | Admitting: Physician Assistant

## 2021-05-17 ENCOUNTER — Ambulatory Visit: Payer: PRIVATE HEALTH INSURANCE | Admitting: Physical Therapy

## 2021-05-17 MED ORDER — HYDROCODONE-ACETAMINOPHEN 5-325 MG PO TABS: 1.0000 | ORAL_TABLET | Freq: Three times a day (TID) | ORAL | 0 refills | Status: AC | PRN

## 2021-05-17 MED ORDER — ONDANSETRON HCL 4 MG PO TABS: 4.0000 mg | ORAL_TABLET | Freq: Three times a day (TID) | ORAL | 0 refills | Status: AC | PRN

## 2021-05-22 ENCOUNTER — Encounter: Payer: Self-pay | Admitting: Physical Therapy

## 2021-05-22 ENCOUNTER — Ambulatory Visit: Payer: PRIVATE HEALTH INSURANCE | Attending: Physician Assistant | Admitting: Physical Therapy

## 2021-05-22 ENCOUNTER — Other Ambulatory Visit: Payer: Self-pay

## 2021-05-22 DIAGNOSIS — M6281 Muscle weakness (generalized): Secondary | ICD-10-CM | POA: Diagnosis present

## 2021-05-22 DIAGNOSIS — R600 Localized edema: Secondary | ICD-10-CM | POA: Insufficient documentation

## 2021-05-22 DIAGNOSIS — M25562 Pain in left knee: Secondary | ICD-10-CM | POA: Insufficient documentation

## 2021-05-22 DIAGNOSIS — R2689 Other abnormalities of gait and mobility: Secondary | ICD-10-CM | POA: Insufficient documentation

## 2021-05-22 DIAGNOSIS — G8929 Other chronic pain: Secondary | ICD-10-CM | POA: Diagnosis present

## 2021-05-22 NOTE — Therapy (Signed)
OUTPATIENT PHYSICAL THERAPY TREATMENT NOTE   Patient Name: Carlos Aguirre MRN: [id number] DOB:17-Feb-1971, 51 y.o., male Today's Date: 05/22/2021  PCP: Oneita Hurt, No REFERRING PROVIDER: Cristie Hem, PA-C   PT End of Session - 05/22/21 1654     Visit Number 6    Number of Visits 17    Date for PT Re-Evaluation 06/25/21    Authorization Type [employer]    Progress Note Due on Visit 10    PT Start Time 1654    PT Stop Time 1735    PT Time Calculation (min) 41 min    Activity Tolerance Patient tolerated treatment well    Behavior During Therapy WFL for tasks assessed/performed                 Past Medical History:  Diagnosis Date   Complication of anesthesia    Hard to wake up per patient   Past Surgical History:  Procedure Laterality Date   HERNIA REPAIR     KNEE SURGERY     SHOULDER SURGERY     TOTAL KNEE ARTHROPLASTY Left 04/02/2021   Procedure: LEFT TOTAL KNEE ARTHROPLASTY;  Surgeon: Tarry Kos, MD;  Location: [facility] OR;  Service: Orthopedics;  Laterality: Left;   Patient Active Problem List   Diagnosis Date Noted   Arthrofibrosis of knee joint, left 05/16/2021   Status post total knee replacement, left 04/02/2021   Primary osteoarthritis of left knee 02/02/2021    REFERRING DIAG: Hx of total knee replacement, left  THERAPY DIAG:  Chronic pain of left knee  Muscle weakness (generalized)  Other abnormalities of gait and mobility  Localized edema  PERTINENT HISTORY: None  PRECAUTIONS: None   SUBJECTIVE: Patient reports he is back at work, he continues to work on stretching the knee as much as he can. His knee remains stiff and swollen.  PAIN:  Are you having pain? Yes NPRS scale: 1/10 Pain location: Knee Pain orientation: Left  PAIN TYPE: Dull aching Pain description: intermittent  Aggravating factors: end range of flexion and extension Relieving factors: cold pack, rest    PATIENT GOALS Pain relief and get back to work without  limitation     OBJECTIVE: (BOLDED MEASURE ASSESSED THIS VISIT) PATIENT SURVEYS:  FOTO 53% functional status   LE AROM/PROM:   AROM Left 05/08/2021 Left 05/10/2021 Left 05/15/2021 Left 05/22/2021  Knee flexion 74 75 70 75  Knee extension -9 -10 -8 -8    PROM Left 04/30/2021  Knee flexion 65  Knee extension      -12    LE MMT:   MMT Right 04/30/2021 Left 04/30/2021  Hip flexion 4 4  Hip extension 4- 4-  Hip abduction 4- 4-  Knee flexion 5 4-  Knee extension 5 4-    Patient able to perform SLR without extension lag this visit (05/15/2021)   TODAY'S TREATMENT 05/22/2021: Therapeutic Exercise: NuStep L5 x 5 min with UE/LE to improve knee motion, while taking subjective  Slant board calf stretch 3 x 30 sec Seated hamstring stretch with manual overpressure to knee 3 x 30 sec Quad set 2 x 10 with 5 sec hold Modified thomas stretch 3 x 30 sec with manual overpressure Heel slide with manual overpressure at end ranges 5 x 10 sec Squat at FM bar x 10 - focus on full range, holding at end range knee flexion Manual Therapy: Seated and supine tib-fem mobs to improve knee flexion and extension Seated and supine knee PROM for flexion and  extension, tibial IR applied with knee flexion stretch Supine patellofemoral mobs all directions   05/15/2021: Therapeutic Exercise: NuStep L5 x 5 min with UE/LE while taking subjective  Heel prop LLLD stretch with 5# and MHP to posterior knee x 5 min Calf stretch at wall 3 x 30 sec Seated hamstring stretch 3 x 30 sec Quad set 2 x 10 with 5 sec hold SLR 2 x 10 Modified thomas stretch 3 x 30 sec with manual overpressure Heel slide with manual overpressure at end ranges x 5 Squat at FM bar x 10 - focus on full range, holding at end range knee flexion Manual Therapy: Seated and supine tib-fem mobs to improve knee flexion and extension Seated and supine knee PROM for flexion and extension, tibial IR applied with knee flexion stretch Supine  patellofemoral mobs all directions Proximal scar massage to improve mobility  05/10/21 Therapeutic Exercise: NuStep L5 x 5 min with UE/LE while taking subjective. Seat was moved forward after 2.5 mins for increased knee flexion. Slant board calf stretch 3 x 30 sec Long seat hamstring stretch 3 x 30 sec Modified thomas stretch 3 x 30 sec with manual overpressure Heel slide with manual overpressure at end ranges x 5 Quad set 10 x 5 sec hold SLR 2 x 5 - focus on quad control to avoid extension lag SAQ with lift 2 x 8 Squat at FM bar x 10 - focus on full range, holding at end range knee flexion AROM for knee flexion with L foot on 8" box, x5, 20" Manual Therapy: Supine tib-fem mobs to improve knee flexion and extension Seated knee contract/relax stretching for flexion Supine patellofemoral mobs all directions Self care:  Discussed and demonstrated prone hangs to address knee ext. ROM. Pt returned demonstration   PATIENT EDUCATION:  Education details: HEP, importance of stretching post manipulation, swelling and pain control following procedure Person educated: Patient Education method: Explanation, Demonstration, Tactile cues, Verbal cues Education comprehension: verbalized understanding, returned demonstration, verbal cues required, tactile cues required, and needs further education   HOME EXERCISE PROGRAM: Access Code: [email or web address]     ASSESSMENT: CLINICAL IMPRESSION: Patient tolerated therapy well with no adverse effects. Therapy focused on continued progression of knee motion and strength. Patient is scheduled for left knee manipulation on Thursday 05/22/2021, and will return to therapy on Friday 05/23/2021. Patient continues to demonstrate impairment with knee motion so will reassess following procedure and continue established POC. Patient reinstructed on HEP and importance of stretching/moving the knee following procedure to maintain knee motion. Patient would benefit from continued  skilled PT to progress mobility and strength in order to reduce pain and maximize functional ability.     GOALS: Goals reviewed with patient? Yes   SHORT TERM GOALS:   STG Name Target Date Goal status  1 Patient will be I with initial HEP in order to progress with therapy. Baseline: progressing with HEP 05/28/2021 ONGOING  2 PT will review FOTO with patient by 3rd visit in order to understand expected progress and outcome with therapy. Baseline: reviewed with patient 05/28/2021 ONGOING  3 Patient will demonstrate active knee motion 5-90 deg in order to improve gait Baseline: 8-75 deg 05/28/2021 ONGOING  4 Patient will ambulate community level without AD to improve access and return to work without limitations Baseline: patient ambulating without assistive device 05/28/2021 ACHIEVED    LONG TERM GOALS:    LTG Name Target Date Goal status  1 Patient will be I with final HEP to maintain progress from  PT. Baseline: provided at eval 06/25/2021 INITIAL  2 Patient will report >/= 66% status on FOTO to indicate improved functional ability. Baseline: 53% 06/25/2021 INITIAL  3 Patient will demonstrate active knee motion 2-120 deg to all normal movement mechanics with transfers and stairs Baseline: 15-60 deg at eval 06/25/2021 INITIAL  4 Patient will exhibit knee strength 5/5 MMT to improve standing tolerance reduce pain with activity Baseline: grossly 4-/5 in available range, extension lag with SLR 06/25/2021 INITIAL  5 Patient will report pain </= 2/10 with all standing and walking activity to maximize functional ability and allow full return to work Baseline: 7/10 06/25/2021 INITIAL      PLAN: PT FREQUENCY: 2x/week   PT DURATION: 8 weeks   PLANNED INTERVENTIONS: Therapeutic exercises, Therapeutic activity, Neuro Muscular re-education, Balance training, Gait training, Patient/Family education, Joint mobilization, Stair training, Aquatic Therapy, Dry Needling, Cryotherapy, Moist heat, Taping,  Vasopneumatic device, and Manual therapy   PLAN FOR NEXT SESSION: Review HEP and progress PRN, manual/stretching for knee flexion and extension, progress strength to closed chain as tolerated, balance and gait training. Assess response to new knee stretches.     Rosana Hoes, PT, DPT, LAT, ATC 05/22/21  5:38 PM Phone: [phone] Fax: [phone]

## 2021-05-24 ENCOUNTER — Encounter: Payer: PRIVATE HEALTH INSURANCE | Admitting: Physical Therapy

## 2021-05-25 ENCOUNTER — Ambulatory Visit: Payer: PRIVATE HEALTH INSURANCE | Admitting: Physical Therapy

## 2021-05-25 ENCOUNTER — Ambulatory Visit: Payer: PRIVATE HEALTH INSURANCE

## 2021-05-25 ENCOUNTER — Other Ambulatory Visit: Payer: Self-pay

## 2021-05-25 ENCOUNTER — Encounter: Payer: Self-pay | Admitting: Physical Therapy

## 2021-05-25 DIAGNOSIS — G8929 Other chronic pain: Secondary | ICD-10-CM

## 2021-05-25 DIAGNOSIS — M25562 Pain in left knee: Secondary | ICD-10-CM | POA: Diagnosis not present

## 2021-05-25 DIAGNOSIS — R2689 Other abnormalities of gait and mobility: Secondary | ICD-10-CM

## 2021-05-25 DIAGNOSIS — M6281 Muscle weakness (generalized): Secondary | ICD-10-CM

## 2021-05-25 DIAGNOSIS — R600 Localized edema: Secondary | ICD-10-CM

## 2021-05-25 NOTE — Therapy (Signed)
OUTPATIENT PHYSICAL THERAPY TREATMENT NOTE   Patient Name: Carlos Aguirre MRN: [id number] DOB:August 06, 1970, 51 y.o., male Today's Date: 05/25/2021  PCP: Oneita Hurt, No REFERRING PROVIDER: Cristie Hem, PA-C   PT End of Session - 05/25/21 0826     Visit Number 7    Number of Visits 17    Date for PT Re-Evaluation 06/25/21    Authorization Type [employer]    Progress Note Due on Visit 10    PT Start Time 0820    PT Stop Time 0920    PT Time Calculation (min) 60 min    Activity Tolerance Patient tolerated treatment well    Behavior During Therapy WFL for tasks assessed/performed                  Past Medical History:  Diagnosis Date   Complication of anesthesia    Hard to wake up per patient   Past Surgical History:  Procedure Laterality Date   HERNIA REPAIR     KNEE SURGERY     SHOULDER SURGERY     TOTAL KNEE ARTHROPLASTY Left 04/02/2021   Procedure: LEFT TOTAL KNEE ARTHROPLASTY;  Surgeon: Tarry Kos, MD;  Location: [facility] OR;  Service: Orthopedics;  Laterality: Left;   Patient Active Problem List   Diagnosis Date Noted   Arthrofibrosis of knee joint, left 05/16/2021   Status post total knee replacement, left 04/02/2021   Primary osteoarthritis of left knee 02/02/2021    REFERRING DIAG: Hx of total knee replacement, left  THERAPY DIAG:  Chronic pain of left knee  Muscle weakness (generalized)  Other abnormalities of gait and mobility  Localized edema  PERTINENT HISTORY: None  PRECAUTIONS: None   SUBJECTIVE: Patient reports he had the manipulation yesterday and the surgeon told him that he was only able to get around 100 deg of knee flexion. He was very sore yesterday but has iced today and does feel better. He is carrying his cane just in case he needs it with walking.   PAIN:  Are you having pain? Yes NPRS scale: 4/10 Pain location: Knee Pain orientation: Left  PAIN TYPE: Dull aching Pain description: intermittent  Aggravating factors:  end range of flexion and extension Relieving factors: cold pack, rest    PATIENT GOALS Pain relief and get back to work without limitation     OBJECTIVE: (BOLDED MEASURE ASSESSED THIS VISIT) PATIENT SURVEYS:  FOTO 53% functional status     EDEMA   Right: 42 cm, Left: 45 cm  LE AROM/PROM:   AROM Left 05/22/2021 Left 05/25/2021  Knee flexion 75 81  Knee extension -8 -10    PROM Left 04/30/2021 Left 05/25/2021  Knee flexion 65 88  Knee extension      -12         -7    LE MMT:   MMT Right 04/30/2021 Left 04/30/2021  Hip flexion 4 4  Hip extension 4- 4-  Hip abduction 4- 4-  Knee flexion 5 4-  Knee extension 5 4-    Patient able to perform SLR without extension lag this visit (05/15/2021)   TODAY'S TREATMENT 05/25/2021: Therapeutic Exercise: Recumbent bike partial revolutions x 6 min to improve knee flexion, while taking subjective Supine heel slides active x 10 Modified thomas stretch with passive knee flexion for quad stretch 2 x 60 sec Hamstring stretch with manual overpessure into extension 3 x 30 sec Slant board calf stretch 3 x 30 sec Manual Therapy: Seated and supine tib-fem mobs to  improve knee flexion and extension Seated and supine knee PROM for flexion and extension, tibial IR applied with knee flexion stretch Supine patellofemoral mobs all directions Vasopneumatic (Game Ready): Location: Left knee Time: 15 min Temperature: 34 deg Pressure: Medium   05/22/2021: Therapeutic Exercise: NuStep L5 x 5 min with UE/LE to improve knee motion, while taking subjective  Slant board calf stretch 3 x 30 sec Seated hamstring stretch with manual overpressure to knee 3 x 30 sec Quad set 2 x 10 with 5 sec hold Modified thomas stretch 3 x 30 sec with manual overpressure Heel slide with manual overpressure at end ranges 5 x 10 sec Squat at FM bar x 10 - focus on full range, holding at end range knee flexion Manual Therapy: Seated and supine tib-fem mobs to improve knee  flexion and extension Seated and supine knee PROM for flexion and extension, tibial IR applied with knee flexion stretch Supine patellofemoral mobs all directions  05/15/2021: Therapeutic Exercise: NuStep L5 x 5 min with UE/LE while taking subjective  Heel prop LLLD stretch with 5# and MHP to posterior knee x 5 min Calf stretch at wall 3 x 30 sec Seated hamstring stretch 3 x 30 sec Quad set 2 x 10 with 5 sec hold SLR 2 x 10 Modified thomas stretch 3 x 30 sec with manual overpressure Heel slide with manual overpressure at end ranges x 5 Squat at FM bar x 10 - focus on full range, holding at end range knee flexion Manual Therapy: Seated and supine tib-fem mobs to improve knee flexion and extension Seated and supine knee PROM for flexion and extension, tibial IR applied with knee flexion stretch Supine patellofemoral mobs all directions Proximal scar massage to improve mobility   PATIENT EDUCATION:  Education details: HEP, importance of stretching and using CPM, instructed patient need to active bend/straighten knee to (patient provided handout with schedule for exercise/CPM/icing/elevating for home) Person educated: Patient Education method: Explanation, Demonstration, Tactile cues, Verbal cues Education comprehension: verbalized understanding, returned demonstration, verbal cues required, tactile cues required, and needs further education   HOME EXERCISE PROGRAM: Access Code: [email or web address]     ASSESSMENT: CLINICAL IMPRESSION: Patient tolerated therapy well with no adverse effects. He demonstrates improved knee flexion this visit post manipulation and was able to tolerate more aggressive stretching. Utilized vaso post session for pain and swelling management as he does exhibit increased swelling of the left knee. Patient provided guidance on using CPM and exercises in combination with icing and rest times to avoid exacerbating pain and swelling to a point it would limit progress. Patient  would benefit from continued skilled PT to progress mobility and strength in order to reduce pain and maximize functional ability.  Objective impairments include Abnormal gait, decreased activity tolerance, decreased balance, difficulty walking, decreased ROM, decreased strength, and pain.     GOALS: Goals reviewed with patient? Yes   SHORT TERM GOALS:   STG Name Target Date Goal status  1 Patient will be I with initial HEP in order to progress with therapy. Baseline: progressing with HEP 05/28/2021 ONGOING  2 PT will review FOTO with patient by 3rd visit in order to understand expected progress and outcome with therapy. Baseline: reviewed with patient 05/28/2021 ONGOING  3 Patient will demonstrate active knee motion 5-90 deg in order to improve gait Baseline: 8-75 deg 05/28/2021 ONGOING  4 Patient will ambulate community level without AD to improve access and return to work without limitations Baseline: patient ambulating without assistive device 05/28/2021 ACHIEVED  LONG TERM GOALS:    LTG Name Target Date Goal status  1 Patient will be I with final HEP to maintain progress from PT. Baseline: provided at eval 06/25/2021 INITIAL  2 Patient will report >/= 66% status on FOTO to indicate improved functional ability. Baseline: 53% 06/25/2021 INITIAL  3 Patient will demonstrate active knee motion 2-120 deg to all normal movement mechanics with transfers and stairs Baseline: 15-60 deg at eval 06/25/2021 INITIAL  4 Patient will exhibit knee strength 5/5 MMT to improve standing tolerance reduce pain with activity Baseline: grossly 4-/5 in available range, extension lag with SLR 06/25/2021 INITIAL  5 Patient will report pain </= 2/10 with all standing and walking activity to maximize functional ability and allow full return to work Baseline: 7/10 06/25/2021 INITIAL      PLAN: PT FREQUENCY: 2x/week   PT DURATION: 8 weeks   PLANNED INTERVENTIONS: Therapeutic exercises, Therapeutic activity,  Neuro Muscular re-education, Balance training, Gait training, Patient/Family education, Joint mobilization, Stair training, Aquatic Therapy, Dry Needling, Cryotherapy, Moist heat, Taping, Vasopneumatic device, and Manual therapy   PLAN FOR NEXT SESSION: Review HEP and progress PRN, manual/stretching for knee flexion and extension, progress strength to closed chain as tolerated, vaso post session, balance and gait training.     Rosana Hoes, PT, DPT, LAT, ATC 05/25/21  9:16 AM Phone: [phone] Fax: [phone]

## 2021-05-28 ENCOUNTER — Ambulatory Visit: Payer: PRIVATE HEALTH INSURANCE | Admitting: Physical Therapy

## 2021-05-28 ENCOUNTER — Other Ambulatory Visit: Payer: Self-pay

## 2021-05-28 ENCOUNTER — Encounter: Payer: Self-pay | Admitting: Physical Therapy

## 2021-05-28 DIAGNOSIS — R2689 Other abnormalities of gait and mobility: Secondary | ICD-10-CM

## 2021-05-28 DIAGNOSIS — R600 Localized edema: Secondary | ICD-10-CM

## 2021-05-28 DIAGNOSIS — M25562 Pain in left knee: Secondary | ICD-10-CM | POA: Diagnosis not present

## 2021-05-28 DIAGNOSIS — G8929 Other chronic pain: Secondary | ICD-10-CM

## 2021-05-28 DIAGNOSIS — M6281 Muscle weakness (generalized): Secondary | ICD-10-CM

## 2021-05-28 NOTE — Therapy (Signed)
OUTPATIENT PHYSICAL THERAPY TREATMENT NOTE   Patient Name: Carlos Aguirre MRN: [id number] DOB:[birth year], 51 y.o., male Today's Date: 05/28/2021  PCP: Oneita Hurt, No REFERRING PROVIDER: Cristie Hem, PA-C   PT End of Session - 05/28/21 1447     Visit Number 8    Number of Visits 17    Date for PT Re-Evaluation 06/25/21    Authorization Type [employer]    PT Start Time 1445    PT Stop Time 1545    PT Time Calculation (min) 60 min    Activity Tolerance Patient tolerated treatment well    Behavior During Therapy WFL for tasks assessed/performed                   Past Medical History:  Diagnosis Date   Complication of anesthesia    Hard to wake up per patient   Past Surgical History:  Procedure Laterality Date   HERNIA REPAIR     KNEE SURGERY     SHOULDER SURGERY     TOTAL KNEE ARTHROPLASTY Left 04/02/2021   Procedure: LEFT TOTAL KNEE ARTHROPLASTY;  Surgeon: Tarry Kos, MD;  Location: [facility] OR;  Service: Orthopedics;  Laterality: Left;   Patient Active Problem List   Diagnosis Date Noted   Arthrofibrosis of knee joint, left 05/16/2021   Status post total knee replacement, left 04/02/2021   Primary osteoarthritis of left knee 02/02/2021    REFERRING DIAG: Hx of total knee replacement, left  THERAPY DIAG:  Chronic pain of left knee  Muscle weakness (generalized)  Other abnormalities of gait and mobility  Localized edema  PERTINENT HISTORY: None  PRECAUTIONS: None   SUBJECTIVE: Patient reports he is very stiff today, but there have been days where de does feel better and the knee has been moving better.   PAIN:  Are you having pain? Yes NPRS scale: 5/10 Pain location: Knee Pain orientation: Left  PAIN TYPE: Surgical Pain description: Constant, stiff Aggravating factors: Moving the knee Relieving factors: Cold pack, rest, elevation    PATIENT GOALS Pain relief and get back to work without limitation     OBJECTIVE: (BOLDED MEASURE  ASSESSED THIS VISIT) PATIENT SURVEYS:  FOTO 53% functional status     EDEMA   Right: 42 cm, Left: 45 cm  LE AROM/PROM:   AROM Left 05/22/2021 Left 05/25/2021 Left 05/28/2021  Knee flexion 75 81 81 (82 seated)  Knee extension -8 -10     PROM Left 04/30/2021 Left 05/25/2021 Left 05/28/2021  Knee flexion 65 88 90  Knee extension      -12         -7     LE MMT:   MMT Right 04/30/2021 Left 04/30/2021  Hip flexion 4 4  Hip extension 4- 4-  Hip abduction 4- 4-  Knee flexion 5 4-  Knee extension 5 4-    Patient able to perform SLR without extension lag this visit (05/15/2021)   TODAY'S TREATMENT 05/28/2021: Therapeutic Exercise: Recumbent bike partial revolutions x 6 min to improve knee flexion, while taking subjective Supine heel slides active x 10 Modified thomas stretch with passive knee flexion for quad stretch 2 x 60 sec Prone qaud stretch  2 x 60 sec Manual Therapy: STM and IASTM for left quad Seated and supine tib-fem mobs to improve knee flexion and extension Seated and supine knee PROM for flexion and extension, tibial IR applied with knee flexion stretch Supine patellofemoral mobs all directions Seated PNF contract-relax stretch for knee flexion  Vasopneumatic (Game Ready): Location: Left knee Time: 15 min Temperature: 34 deg Pressure: Medium   05/25/2021: Therapeutic Exercise: Recumbent bike partial revolutions x 6 min to improve knee flexion, while taking subjective Supine heel slides active x 10 Modified thomas stretch with passive knee flexion for quad stretch 2 x 60 sec Hamstring stretch with manual overpessure into extension 3 x 30 sec Slant board calf stretch 3 x 30 sec Manual Therapy: Seated and supine tib-fem mobs to improve knee flexion and extension Seated and supine knee PROM for flexion and extension, tibial IR applied with knee flexion stretch Supine patellofemoral mobs all directions Vasopneumatic (Game Ready): Location: Left knee Time: 15  min Temperature: 34 deg Pressure: Medium  05/22/2021: Therapeutic Exercise: NuStep L5 x 5 min with UE/LE to improve knee motion, while taking subjective  Slant board calf stretch 3 x 30 sec Seated hamstring stretch with manual overpressure to knee 3 x 30 sec Quad set 2 x 10 with 5 sec hold Modified thomas stretch 3 x 30 sec with manual overpressure Heel slide with manual overpressure at end ranges 5 x 10 sec Squat at FM bar x 10 - focus on full range, holding at end range knee flexion Manual Therapy: Seated and supine tib-fem mobs to improve knee flexion and extension Seated and supine knee PROM for flexion and extension, tibial IR applied with knee flexion stretch Supine patellofemoral mobs all directions   PATIENT EDUCATION:  Education details: HEP, importance of stretching and using CPM, instructed patient need to active bend/straighten knee to (patient provided handout with schedule for exercise/CPM/icing/elevating for home) Person educated: Patient Education method: Explanation, Demonstration, Tactile cues, Verbal cues Education comprehension: verbalized understanding, returned demonstration, verbal cues required, tactile cues required, and needs further education   HOME EXERCISE PROGRAM: Access Code: [email or web address]     ASSESSMENT: CLINICAL IMPRESSION: Patient tolerated therapy well with no adverse effects. Therapy continues to focus primarily on improving knee motion and used majority manual this visit as patient reported increased stiffness and quad tightness. He did improve in stiffness following therapy and was able to demonstrate knee flexion similar to last assessed. Patient encouraged to continue using ice/elevation to control swelling. Patient would benefit from continued skilled PT to progress mobility and strength in order to reduce pain and maximize functional ability.  Objective impairments include Abnormal gait, decreased activity tolerance, decreased balance, difficulty  walking, decreased ROM, decreased strength, and pain.     GOALS: Goals reviewed with patient? Yes   SHORT TERM GOALS:   STG Name Target Date Goal status  1 Patient will be I with initial HEP in order to progress with therapy. Baseline: progressing with HEP 05/28/2021 ONGOING  2 PT will review FOTO with patient by 3rd visit in order to understand expected progress and outcome with therapy. Baseline: reviewed with patient 05/28/2021 ONGOING  3 Patient will demonstrate active knee motion 5-90 deg in order to improve gait Baseline: 8-75 deg 05/28/2021 ONGOING  4 Patient will ambulate community level without AD to improve access and return to work without limitations Baseline: patient ambulating without assistive device 05/28/2021 ACHIEVED    LONG TERM GOALS:    LTG Name Target Date Goal status  1 Patient will be I with final HEP to maintain progress from PT. Baseline: provided at eval 06/25/2021 INITIAL  2 Patient will report >/= 66% status on FOTO to indicate improved functional ability. Baseline: 53% 06/25/2021 INITIAL  3 Patient will demonstrate active knee motion 2-120 deg to all normal movement mechanics  with transfers and stairs Baseline: 15-60 deg at eval 06/25/2021 INITIAL  4 Patient will exhibit knee strength 5/5 MMT to improve standing tolerance reduce pain with activity Baseline: grossly 4-/5 in available range, extension lag with SLR 06/25/2021 INITIAL  5 Patient will report pain </= 2/10 with all standing and walking activity to maximize functional ability and allow full return to work Baseline: 7/10 06/25/2021 INITIAL      PLAN: PT FREQUENCY: 2x/week   PT DURATION: 8 weeks   PLANNED INTERVENTIONS: Therapeutic exercises, Therapeutic activity, Neuro Muscular re-education, Balance training, Gait training, Patient/Family education, Joint mobilization, Stair training, Aquatic Therapy, Dry Needling, Cryotherapy, Moist heat, Taping, Vasopneumatic device, and Manual therapy   PLAN  FOR NEXT SESSION: Review HEP and progress PRN, manual/stretching for knee flexion and extension, progress strength to closed chain as tolerated, vaso post session, balance and gait training.     Rosana Hoes, PT, DPT, LAT, ATC 05/28/21  3:32 PM Phone: [phone] Fax: [phone]

## 2021-05-29 ENCOUNTER — Encounter: Payer: Self-pay | Admitting: Physical Therapy

## 2021-05-29 ENCOUNTER — Ambulatory Visit: Payer: PRIVATE HEALTH INSURANCE | Admitting: Physical Therapy

## 2021-05-29 DIAGNOSIS — R600 Localized edema: Secondary | ICD-10-CM

## 2021-05-29 DIAGNOSIS — G8929 Other chronic pain: Secondary | ICD-10-CM

## 2021-05-29 DIAGNOSIS — M25562 Pain in left knee: Secondary | ICD-10-CM | POA: Diagnosis not present

## 2021-05-29 DIAGNOSIS — R2689 Other abnormalities of gait and mobility: Secondary | ICD-10-CM

## 2021-05-29 DIAGNOSIS — M6281 Muscle weakness (generalized): Secondary | ICD-10-CM

## 2021-05-29 NOTE — Therapy (Signed)
OUTPATIENT PHYSICAL THERAPY TREATMENT NOTE   Patient Name: Carlos Aguirre MRN: [id number] DOB:1970/07/21, 51 y.o., male Today's Date: 05/29/2021  PCP: Oneita Hurt, No REFERRING PROVIDER: Cristie Hem, PA-C   PT End of Session - 05/29/21 1533     Visit Number 9    Number of Visits 17    Date for PT Re-Evaluation 06/25/21    Authorization Type [employer]    PT Start Time 1530    PT Stop Time 1630    PT Time Calculation (min) 60 min    Activity Tolerance Patient tolerated treatment well    Behavior During Therapy WFL for tasks assessed/performed                    Past Medical History:  Diagnosis Date   Complication of anesthesia    Hard to wake up per patient   Past Surgical History:  Procedure Laterality Date   HERNIA REPAIR     KNEE SURGERY     SHOULDER SURGERY     TOTAL KNEE ARTHROPLASTY Left 04/02/2021   Procedure: LEFT TOTAL KNEE ARTHROPLASTY;  Surgeon: Tarry Kos, MD;  Location: [facility] OR;  Service: Orthopedics;  Laterality: Left;   Patient Active Problem List   Diagnosis Date Noted   Arthrofibrosis of knee joint, left 05/16/2021   Status post total knee replacement, left 04/02/2021   Primary osteoarthritis of left knee 02/02/2021    REFERRING DIAG: Hx of total knee replacement, left  THERAPY DIAG:  Chronic pain of left knee  Muscle weakness (generalized)  Other abnormalities of gait and mobility  Localized edema  PERTINENT HISTORY: None  PRECAUTIONS: None   SUBJECTIVE: Patient reports he is not as stiff as yesterday, but he did do a lot of stretching earlier today.  PAIN:  Are you having pain? Yes NPRS scale: 4/10 Pain location: Knee Pain orientation: Left  PAIN TYPE: Surgical Pain description: Constant, stiff Aggravating factors: Moving the knee Relieving factors: Cold pack, rest, elevation    PATIENT GOALS Pain relief and get back to work without limitation     OBJECTIVE: (BOLDED MEASURE ASSESSED THIS VISIT) PATIENT  SURVEYS:  FOTO 53% functional status     EDEMA   Right: 42 cm, Left: 45 cm  LE AROM/PROM:   AROM Left 05/22/2021 Left 05/25/2021 Left 05/28/2021 Left 05/29/2021  Knee flexion 75 81 81 (82 seated) 84 (seated)  Knee extension -8 -10      PROM Left 04/30/2021 Left 05/25/2021 Left 05/28/2021  Knee flexion 65 88 90  Knee extension      -12         -7     LE MMT:   MMT Right 04/30/2021 Left 04/30/2021  Hip flexion 4 4  Hip extension 4- 4-  Hip abduction 4- 4-  Knee flexion 5 4-  Knee extension 5 4-    Patient able to perform SLR without extension lag this visit (05/15/2021)   TODAY'S TREATMENT 05/29/2021: Therapeutic Exercise: Recumbent bike partial revolutions x 6 min to improve knee flexion, while taking subjective Hamstring stretch with manual overpessure into extension 3 x 30 sec Slant board calf stretch 3 x 30 sec Supine heel slides active with manual overpressure x 10 Modified thomas stretch with passive knee flexion for quad stretch 2 x 60 sec TRX squat through full range 5 x 5 sec hold Manual Therapy: Seated and supine tib-fem mobs to improve knee flexion and extension, belt with sustained traction mob in seated with knee flexion  Seated and supine knee PROM for flexion and extension, tibial IR applied with knee flexion stretch Supine patellofemoral mobs all directions Proximal scar mobilization Vasopneumatic (Game Ready): Location: Left knee Time: 15 min Temperature: 34 deg Pressure: Medium   05/28/2021: Therapeutic Exercise: Recumbent bike partial revolutions x 6 min to improve knee flexion, while taking subjective Supine heel slides active x 10 Modified thomas stretch with passive knee flexion for quad stretch 2 x 60 sec Prone qaud stretch  2 x 60 sec Manual Therapy: STM and IASTM for left quad Seated and supine tib-fem mobs to improve knee flexion and extension Seated and supine knee PROM for flexion and extension, tibial IR applied with knee flexion  stretch Supine patellofemoral mobs all directions Seated PNF contract-relax stretch for knee flexion Vasopneumatic (Game Ready): Location: Left knee Time: 15 min Temperature: 34 deg Pressure: Medium  05/25/2021: Therapeutic Exercise: Recumbent bike partial revolutions x 6 min to improve knee flexion, while taking subjective Supine heel slides active x 10 Modified thomas stretch with passive knee flexion for quad stretch 2 x 60 sec Hamstring stretch with manual overpessure into extension 3 x 30 sec Slant board calf stretch 3 x 30 sec Manual Therapy: Seated and supine tib-fem mobs to improve knee flexion and extension Seated and supine knee PROM for flexion and extension, tibial IR applied with knee flexion stretch Supine patellofemoral mobs all directions Vasopneumatic (Game Ready): Location: Left knee Time: 15 min Temperature: 34 deg Pressure: Medium   PATIENT EDUCATION:  Education details: HEP Person educated: Patient Education method: Programmer, multimedia, Facilities manager, Actor cues, Verbal cues Education comprehension: verbalized understanding, returned demonstration, verbal cues required, tactile cues required, and needs further education   HOME EXERCISE PROGRAM: Access Code: [email or web address]     ASSESSMENT: CLINICAL IMPRESSION: Patient tolerated therapy well with no adverse effects. Therapy continues to focus primarily on improving knee motion. He continues to demonstrate initial stiffness at beginning of visit that improves with stretching and manual. He did demonstrate improved active flexion this visit but no improvement in passive flexion. He reports majority of pain occurs with transitions from knee flexion to extension especially post stretching. Continues to used vaso post session for pain and swelling control. Patient would benefit from continued skilled PT to progress mobility and strength in order to reduce pain and maximize functional ability.  Objective impairments include  Abnormal gait, decreased activity tolerance, decreased balance, difficulty walking, decreased ROM, decreased strength, and pain.     GOALS: Goals reviewed with patient? Yes   SHORT TERM GOALS:   STG Name Target Date Goal status  1 Patient will be I with initial HEP in order to progress with therapy. Baseline: progressing with HEP 05/28/2021 ONGOING  2 PT will review FOTO with patient by 3rd visit in order to understand expected progress and outcome with therapy. Baseline: reviewed with patient 05/28/2021 ONGOING  3 Patient will demonstrate active knee motion 5-90 deg in order to improve gait Baseline: 8-75 deg 05/28/2021 ONGOING  4 Patient will ambulate community level without AD to improve access and return to work without limitations Baseline: patient ambulating without assistive device 05/28/2021 ACHIEVED    LONG TERM GOALS:    LTG Name Target Date Goal status  1 Patient will be I with final HEP to maintain progress from PT. Baseline: provided at eval 06/25/2021 INITIAL  2 Patient will report >/= 66% status on FOTO to indicate improved functional ability. Baseline: 53% 06/25/2021 INITIAL  3 Patient will demonstrate active knee motion 2-120 deg to all  normal movement mechanics with transfers and stairs Baseline: 15-60 deg at eval 06/25/2021 INITIAL  4 Patient will exhibit knee strength 5/5 MMT to improve standing tolerance reduce pain with activity Baseline: grossly 4-/5 in available range, extension lag with SLR 06/25/2021 INITIAL  5 Patient will report pain </= 2/10 with all standing and walking activity to maximize functional ability and allow full return to work Baseline: 7/10 06/25/2021 INITIAL      PLAN: PT FREQUENCY: 2x/week   PT DURATION: 8 weeks   PLANNED INTERVENTIONS: Therapeutic exercises, Therapeutic activity, Neuro Muscular re-education, Balance training, Gait training, Patient/Family education, Joint mobilization, Stair training, Aquatic Therapy, Dry Needling,  Cryotherapy, Moist heat, Taping, Vasopneumatic device, and Manual therapy   PLAN FOR NEXT SESSION: Review HEP and progress PRN, manual/stretching for knee flexion and extension, progress strength to closed chain as tolerated, vaso post session, balance and gait training.     Rosana Hoes, PT, DPT, LAT, ATC 05/29/21  5:04 PM Phone: [phone] Fax: [phone]

## 2021-05-30 ENCOUNTER — Ambulatory Visit: Payer: PRIVATE HEALTH INSURANCE | Admitting: Physical Therapy

## 2021-05-30 ENCOUNTER — Encounter: Payer: Self-pay | Admitting: Physical Therapy

## 2021-05-30 ENCOUNTER — Other Ambulatory Visit: Payer: Self-pay

## 2021-05-30 DIAGNOSIS — M25562 Pain in left knee: Secondary | ICD-10-CM

## 2021-05-30 DIAGNOSIS — R600 Localized edema: Secondary | ICD-10-CM

## 2021-05-30 DIAGNOSIS — M6281 Muscle weakness (generalized): Secondary | ICD-10-CM

## 2021-05-30 DIAGNOSIS — G8929 Other chronic pain: Secondary | ICD-10-CM

## 2021-05-30 DIAGNOSIS — R2689 Other abnormalities of gait and mobility: Secondary | ICD-10-CM

## 2021-05-30 NOTE — Therapy (Signed)
OUTPATIENT PHYSICAL THERAPY TREATMENT NOTE   Patient Name: Carlos Aguirre MRN: [id number] DOB:05/23/1970, 51 y.o., male Today's Date: 05/30/2021  PCP: Oneita Hurt, No REFERRING PROVIDER: Cristie Hem, PA-C   PT End of Session - 05/30/21 1644     Visit Number 10    Number of Visits 17    Date for PT Re-Evaluation 06/25/21    Authorization Type [employer]    Progress Note Due on Visit 10    PT Start Time 1640    PT Stop Time 1725    PT Time Calculation (min) 45 min    Activity Tolerance Patient tolerated treatment well    Behavior During Therapy WFL for tasks assessed/performed                     Past Medical History:  Diagnosis Date   Complication of anesthesia    Hard to wake up per patient   Past Surgical History:  Procedure Laterality Date   HERNIA REPAIR     KNEE SURGERY     SHOULDER SURGERY     TOTAL KNEE ARTHROPLASTY Left 04/02/2021   Procedure: LEFT TOTAL KNEE ARTHROPLASTY;  Surgeon: Tarry Kos, MD;  Location: [facility] OR;  Service: Orthopedics;  Laterality: Left;   Patient Active Problem List   Diagnosis Date Noted   Arthrofibrosis of knee joint, left 05/16/2021   Status post total knee replacement, left 04/02/2021   Primary osteoarthritis of left knee 02/02/2021    REFERRING DIAG: Hx of total knee replacement, left  THERAPY DIAG:  Chronic pain of left knee  Muscle weakness (generalized)  Other abnormalities of gait and mobility  Localized edema  PERTINENT HISTORY: None  PRECAUTIONS: None   SUBJECTIVE: Patient reports he is doing well, his knee continues to be stiff and has pain when he goes from extension to flexion.  PAIN:  Are you having pain? Yes NPRS scale: 4/10 Pain location: Knee Pain orientation: Left  PAIN TYPE: Surgical Pain description: Constant, stiff Aggravating factors: Moving the knee Relieving factors: Cold pack, rest, elevation    PATIENT GOALS Pain relief and get back to work without limitation      OBJECTIVE: (BOLDED MEASURE ASSESSED THIS VISIT) PATIENT SURVEYS:  FOTO 53% functional status     EDEMA   Right: 42 cm, Left: 45 cm  LE AROM/PROM:   AROM Left 05/22/2021 Left 05/25/2021 Left 05/28/2021 Left 05/29/2021 Left 05/30/2021  Knee flexion 75 81 81 (82 seated) 84 (seated) 80 supine / 84 seated  Knee extension -8 -10   -8    PROM Left 04/30/2021 Left 05/25/2021 Left 05/28/2021 Left 05/30/2021  Knee flexion 65 88 90 88  Knee extension      -12         -7          -6    LE MMT:   MMT Right 04/30/2021 Left 04/30/2021  Hip flexion 4 4  Hip extension 4- 4-  Hip abduction 4- 4-  Knee flexion 5 4-  Knee extension 5 4-    Patient able to perform SLR without extension lag this visit (05/15/2021)   TODAY'S TREATMENT 05/30/2021: Therapeutic Exercise: Recumbent bike partial revolutions x 6 min to improve knee flexion, while taking subjective Seated knee flexion/extension AROM multiple bouts following passive knee stretching Supine heel prop with 10# x 5 min Supine heel slide AROM multiple bouts following passive knee stretching Standing knee flexion stretch at step 5 x 10 sec hold TRX squat through  full range 5 x 5 sec hold Manual Therapy: Seated and supine tib-fem mobs to improve knee flexion and extension, belt with sustained traction mob in seated with knee flexion Seated and supine knee PROM for flexion and extension Supine patellofemoral mobs all directions Seated PNF contract-relax stretch for knee flexion   05/29/2021: Therapeutic Exercise: Recumbent bike partial revolutions x 6 min to improve knee flexion, while taking subjective Hamstring stretch with manual overpessure into extension 3 x 30 sec Slant board calf stretch 3 x 30 sec Supine heel slides active with manual overpressure x 10 Modified thomas stretch with passive knee flexion for quad stretch 2 x 60 sec TRX squat through full range 5 x 5 sec hold Manual Therapy: Seated and supine tib-fem mobs to improve  knee flexion and extension, belt with sustained traction mob in seated with knee flexion Seated and supine knee PROM for flexion and extension, tibial IR applied with knee flexion stretch Supine patellofemoral mobs all directions Proximal scar mobilization Vasopneumatic (Game Ready): Location: Left knee Time: 15 min Temperature: 34 deg Pressure: Medium  05/28/2021: Therapeutic Exercise: Recumbent bike partial revolutions x 6 min to improve knee flexion, while taking subjective Supine heel slides active x 10 Modified thomas stretch with passive knee flexion for quad stretch 2 x 60 sec Prone qaud stretch  2 x 60 sec Manual Therapy: STM and IASTM for left quad Seated and supine tib-fem mobs to improve knee flexion and extension Seated and supine knee PROM for flexion and extension, tibial IR applied with knee flexion stretch Supine patellofemoral mobs all directions Seated PNF contract-relax stretch for knee flexion Vasopneumatic (Game Ready): Location: Left knee Time: 15 min Temperature: 34 deg Pressure: Medium   PATIENT EDUCATION:  Education details: HEP, heel prop with weight for extension Person educated: Patient Education method: Explanation, Demonstration, Tactile cues, Verbal cues Education comprehension: verbalized understanding, returned demonstration, verbal cues required, tactile cues required, and needs further education   HOME EXERCISE PROGRAM: Access Code: [email or web address]     ASSESSMENT: CLINICAL IMPRESSION: Patient tolerated therapy well with no adverse effects. He demonstrates slight improvement in knee extension this visit and he was instructed on using heel prop with weight to stretch for knee extension. Overall patient still limited with knee motion but seems to gradually be progressing. Patient deferred vaso this visit. Patient would benefit from continued skilled PT to progress mobility and strength in order to reduce pain and maximize functional  ability.  Objective impairments include Abnormal gait, decreased activity tolerance, decreased balance, difficulty walking, decreased ROM, decreased strength, and pain.     GOALS: Goals reviewed with patient? Yes   SHORT TERM GOALS:   STG Name Target Date Goal status  1 Patient will be I with initial HEP in order to progress with therapy. Baseline: progressing with HEP 05/28/2021 ONGOING  2 PT will review FOTO with patient by 3rd visit in order to understand expected progress and outcome with therapy. Baseline: reviewed with patient 05/28/2021 ONGOING  3 Patient will demonstrate active knee motion 5-90 deg in order to improve gait Baseline: 8-75 deg 05/28/2021 ONGOING  4 Patient will ambulate community level without AD to improve access and return to work without limitations Baseline: patient ambulating without assistive device 05/28/2021 ACHIEVED    LONG TERM GOALS:    LTG Name Target Date Goal status  1 Patient will be I with final HEP to maintain progress from PT. Baseline: provided at eval 06/25/2021 INITIAL  2 Patient will report >/= 66% status on FOTO  to indicate improved functional ability. Baseline: 53% 06/25/2021 INITIAL  3 Patient will demonstrate active knee motion 2-120 deg to all normal movement mechanics with transfers and stairs Baseline: 15-60 deg at eval 06/25/2021 INITIAL  4 Patient will exhibit knee strength 5/5 MMT to improve standing tolerance reduce pain with activity Baseline: grossly 4-/5 in available range, extension lag with SLR 06/25/2021 INITIAL  5 Patient will report pain </= 2/10 with all standing and walking activity to maximize functional ability and allow full return to work Baseline: 7/10 06/25/2021 INITIAL      PLAN: PT FREQUENCY: 2x/week   PT DURATION: 8 weeks   PLANNED INTERVENTIONS: Therapeutic exercises, Therapeutic activity, Neuro Muscular re-education, Balance training, Gait training, Patient/Family education, Joint mobilization, Stair training,  Aquatic Therapy, Dry Needling, Cryotherapy, Moist heat, Taping, Vasopneumatic device, and Manual therapy   PLAN FOR NEXT SESSION: Review HEP and progress PRN, manual/stretching for knee flexion and extension, progress strength to closed chain as tolerated, vaso post session, balance and gait training.     Rosana Hoes, PT, DPT, LAT, ATC 05/30/21  5:30 PM Phone: [phone] Fax: [phone]

## 2021-05-31 ENCOUNTER — Encounter: Payer: Self-pay | Admitting: Physical Therapy

## 2021-05-31 ENCOUNTER — Ambulatory Visit (INDEPENDENT_AMBULATORY_CARE_PROVIDER_SITE_OTHER): Payer: PRIVATE HEALTH INSURANCE | Admitting: Orthopaedic Surgery

## 2021-05-31 ENCOUNTER — Ambulatory Visit: Payer: PRIVATE HEALTH INSURANCE | Admitting: Physical Therapy

## 2021-05-31 ENCOUNTER — Other Ambulatory Visit: Payer: Self-pay

## 2021-05-31 ENCOUNTER — Encounter: Payer: Self-pay | Admitting: Orthopaedic Surgery

## 2021-05-31 DIAGNOSIS — M6281 Muscle weakness (generalized): Secondary | ICD-10-CM

## 2021-05-31 DIAGNOSIS — M24662 Ankylosis, left knee: Secondary | ICD-10-CM

## 2021-05-31 DIAGNOSIS — R2689 Other abnormalities of gait and mobility: Secondary | ICD-10-CM

## 2021-05-31 DIAGNOSIS — G8929 Other chronic pain: Secondary | ICD-10-CM

## 2021-05-31 DIAGNOSIS — R600 Localized edema: Secondary | ICD-10-CM

## 2021-05-31 DIAGNOSIS — M25562 Pain in left knee: Secondary | ICD-10-CM | POA: Diagnosis not present

## 2021-05-31 MED ORDER — METHOCARBAMOL 500 MG PO TABS: 500.0000 mg | ORAL_TABLET | Freq: Two times a day (BID) | ORAL | 6 refills | Status: AC | PRN

## 2021-05-31 MED ORDER — OXYCODONE-ACETAMINOPHEN 5-325 MG PO TABS: 1.0000 | ORAL_TABLET | ORAL | 0 refills | Status: AC | PRN

## 2021-05-31 NOTE — Progress Notes (Signed)
° °  Post-Op Visit Note   Patient: Carlos Aguirre           Date of Birth: 05-Aguirre-1972           MRN: [id number] Visit Date: 05/31/2021 PCP: Pcp, No   Assessment & Plan:  Chief Complaint:  Chief Complaint  Patient presents with   Left Knee - Pain   Visit Diagnoses:  1. Arthrofibrosis of knee joint, left     Plan: [place] is 1 week status post left total knee manipulation.  He is getting to about 85 to 90 degrees of flexion at PT.  He has been doing the CPM about 3 hours a day.  No real complaints.  Examination of the left knee shows range of motion of approximately 10 to 80 degrees.  Moderate swelling.  No signs of infection.  Finnigan will continue to work aggressively on getting his range of motion at PT and CPM and aggressive use of ice and anti-inflammatories.  I refilled Percocet and Robaxin.  Recheck in 4 weeks.  Follow-Up Instructions: Return in about 4 weeks (around 06/28/2021).   Orders:  No orders of the defined types were placed in this encounter.  No orders of the defined types were placed in this encounter.   Imaging: No results found.  PMFS History: Patient Active Problem List   Diagnosis Date Noted   Arthrofibrosis of knee joint, left 05/16/2021   Status post total knee replacement, left 04/02/2021   Primary osteoarthritis of left knee 02/02/2021   Past Medical History:  Diagnosis Date   Complication of anesthesia    Hard to wake up per patient    History reviewed. No pertinent family history.  Past Surgical History:  Procedure Laterality Date   HERNIA REPAIR     KNEE SURGERY     SHOULDER SURGERY     TOTAL KNEE ARTHROPLASTY Left 04/02/2021   Procedure: LEFT TOTAL KNEE ARTHROPLASTY;  Surgeon: Tarry Kos, MD;  Location: [facility] OR;  Service: Orthopedics;  Laterality: Left;   Social History   Occupational History   Not on file  Tobacco Use   Smoking status: Never   Smokeless tobacco: Not on file  Vaping Use   Vaping Use: Never used  Substance and  Sexual Activity   Alcohol use: No   Drug use: No   Sexual activity: Not on file

## 2021-05-31 NOTE — Therapy (Signed)
OUTPATIENT PHYSICAL THERAPY TREATMENT NOTE   Patient Name: Carlos Aguirre MRN: [id number] DOB:1970/05/31, 51 y.o., male Today's Date: 05/31/2021  PCP: Oneita Hurt, No REFERRING PROVIDER: Cristie Hem, PA-C   PT End of Session - 05/31/21 1535     Visit Number 11    Number of Visits 17    Date for PT Re-Evaluation 06/25/21    Authorization Type [employer]    PT Start Time 1330    PT Stop Time 1415    PT Time Calculation (min) 45 min    Activity Tolerance Patient tolerated treatment well    Behavior During Therapy WFL for tasks assessed/performed                      Past Medical History:  Diagnosis Date   Complication of anesthesia    Hard to wake up per patient   Past Surgical History:  Procedure Laterality Date   HERNIA REPAIR     KNEE SURGERY     SHOULDER SURGERY     TOTAL KNEE ARTHROPLASTY Left 04/02/2021   Procedure: LEFT TOTAL KNEE ARTHROPLASTY;  Surgeon: Tarry Kos, MD;  Location: [facility];  Service: Orthopedics;  Laterality: Left;   Patient Active Problem List   Diagnosis Date Noted   Arthrofibrosis of knee joint, left 05/16/2021   Status post total knee replacement, left 04/02/2021   Primary osteoarthritis of left knee 02/02/2021    REFERRING DIAG: Hx of total knee replacement, left  THERAPY DIAG:  Chronic pain of left knee  Muscle weakness (generalized)  Other abnormalities of gait and mobility  Localized edema  PERTINENT HISTORY: None  PRECAUTIONS: None   SUBJECTIVE: Patient saw the surgeon this morning was was instructed to continue working on his knee motion. He states he was able to increase flexion on the CPM after yesterdays visit.  PAIN:  Are you having pain? Yes NPRS scale: 4/10 Pain location: Knee Pain orientation: Left  PAIN TYPE: Surgical Pain description: Constant, stiff Aggravating factors: Moving the knee Relieving factors: Cold pack, rest, elevation    PATIENT GOALS Pain relief and get back to work  without limitation     OBJECTIVE: (BOLDED MEASURE ASSESSED THIS VISIT) PATIENT SURVEYS:  FOTO 53% functional status     EDEMA   Right: 42 cm, Left: 45 cm  LE AROM/PROM:   AROM Left 05/28/2021 Left 05/29/2021 Left 05/30/2021 Left 05/31/2021  Knee flexion 81 (82 seated) 84 (seated) 80 supine / 84 seated 84 (seated)  Knee extension   -8 -8    PROM Left 04/30/2021 Left 05/25/2021 Left 05/28/2021 Left 05/30/2021  Knee flexion 65 88 90 88  Knee extension      -12         -7          -6    LE MMT:   MMT Right 04/30/2021 Left 04/30/2021  Hip flexion 4 4  Hip extension 4- 4-  Hip abduction 4- 4-  Knee flexion 5 4-  Knee extension 5 4-    Patient able to perform SLR without extension lag this visit (05/15/2021)   TODAY'S TREATMENT 05/31/2021: Therapeutic Exercise: Recumbent bike partial revolutions x 6 min to improve knee flexion, while taking subjective Supine heel prop with 10# x 5 min with MHP applied to posterior knee Quad set with heel prop 5 x 5 sec, with manual overpressure 5 x 5 sec Supine heel slide AROM multiple bouts following passive knee stretching Seated knee flexion/extension AROM multiple  bouts following passive knee stretching TRX squat through full range 5 x 5 sec hold Manual Therapy: Seated and supine tib-fem mobs to improve knee flexion and extension, belt with sustained traction mob in seated with knee flexion Seated and supine knee PROM for flexion and extension Supine patellofemoral mobs all directions   05/30/2021: Therapeutic Exercise: Recumbent bike partial revolutions x 6 min to improve knee flexion, while taking subjective Seated knee flexion/extension AROM multiple bouts following passive knee stretching Supine heel prop with 10# x 5 min Supine heel slide AROM multiple bouts following passive knee stretching Standing knee flexion stretch at step 5 x 10 sec hold TRX squat through full range 5 x 5 sec hold Manual Therapy: Seated and supine tib-fem  mobs to improve knee flexion and extension, belt with sustained traction mob in seated with knee flexion Seated and supine knee PROM for flexion and extension Supine patellofemoral mobs all directions Seated PNF contract-relax stretch for knee flexion  05/29/2021: Therapeutic Exercise: Recumbent bike partial revolutions x 6 min to improve knee flexion, while taking subjective Hamstring stretch with manual overpessure into extension 3 x 30 sec Slant board calf stretch 3 x 30 sec Supine heel slides active with manual overpressure x 10 Modified thomas stretch with passive knee flexion for quad stretch 2 x 60 sec TRX squat through full range 5 x 5 sec hold Manual Therapy: Seated and supine tib-fem mobs to improve knee flexion and extension, belt with sustained traction mob in seated with knee flexion Seated and supine knee PROM for flexion and extension, tibial IR applied with knee flexion stretch Supine patellofemoral mobs all directions Proximal scar mobilization Vasopneumatic (Game Ready): Location: Left knee Time: 15 min Temperature: 34 deg Pressure: Medium   PATIENT EDUCATION:  Education details: HEP, heel prop with weight for extension Person educated: Patient Education method: Explanation, Demonstration, Tactile cues, Verbal cues Education comprehension: verbalized understanding, returned demonstration, verbal cues required, tactile cues required, and needs further education   HOME EXERCISE PROGRAM: Access Code: [email or web address]     ASSESSMENT: CLINICAL IMPRESSION: Patient tolerated therapy well with no adverse effects. Therapy focused more on knee extension this visit, and continued to work on knee motion. Utilized MHP to reduce muscular tension/guarding and patient report much more comfortable stretching. No changes made to his exercise program at home. Patient would benefit from continued skilled PT to progress mobility and strength in order to reduce pain and maximize functional  ability.  Objective impairments include Abnormal gait, decreased activity tolerance, decreased balance, difficulty walking, decreased ROM, decreased strength, and pain.     GOALS: Goals reviewed with patient? Yes   SHORT TERM GOALS:   STG Name Target Date Goal status  1 Patient will be I with initial HEP in order to progress with therapy. Baseline: progressing with HEP 05/28/2021 ONGOING  2 PT will review FOTO with patient by 3rd visit in order to understand expected progress and outcome with therapy. Baseline: reviewed with patient 05/28/2021 ONGOING  3 Patient will demonstrate active knee motion 5-90 deg in order to improve gait Baseline: 8-75 deg 05/28/2021 ONGOING  4 Patient will ambulate community level without AD to improve access and return to work without limitations Baseline: patient ambulating without assistive device 05/28/2021 ACHIEVED    LONG TERM GOALS:    LTG Name Target Date Goal status  1 Patient will be I with final HEP to maintain progress from PT. Baseline: provided at eval 06/25/2021 INITIAL  2 Patient will report >/= 66% status on FOTO  to indicate improved functional ability. Baseline: 53% 06/25/2021 INITIAL  3 Patient will demonstrate active knee motion 2-120 deg to all normal movement mechanics with transfers and stairs Baseline: 15-60 deg at eval 06/25/2021 INITIAL  4 Patient will exhibit knee strength 5/5 MMT to improve standing tolerance reduce pain with activity Baseline: grossly 4-/5 in available range, extension lag with SLR 06/25/2021 INITIAL  5 Patient will report pain </= 2/10 with all standing and walking activity to maximize functional ability and allow full return to work Baseline: 7/10 06/25/2021 INITIAL      PLAN: PT FREQUENCY: 2x/week   PT DURATION: 8 weeks   PLANNED INTERVENTIONS: Therapeutic exercises, Therapeutic activity, Neuro Muscular re-education, Balance training, Gait training, Patient/Family education, Joint mobilization, Stair training,  Aquatic Therapy, Dry Needling, Cryotherapy, Moist heat, Taping, Vasopneumatic device, and Manual therapy   PLAN FOR NEXT SESSION: Review HEP and progress PRN, manual/stretching for knee flexion and extension, progress strength to closed chain as tolerated, vaso post session, balance and gait training.     Rosana Hoes, PT, DPT, LAT, ATC 05/31/21  4:15 PM Phone: [phone] Fax: [phone]

## 2021-06-01 ENCOUNTER — Ambulatory Visit: Payer: PRIVATE HEALTH INSURANCE | Admitting: Physical Therapy

## 2021-06-01 ENCOUNTER — Other Ambulatory Visit: Payer: Self-pay

## 2021-06-01 ENCOUNTER — Encounter: Payer: Self-pay | Admitting: Physical Therapy

## 2021-06-01 DIAGNOSIS — M25562 Pain in left knee: Secondary | ICD-10-CM | POA: Diagnosis not present

## 2021-06-01 DIAGNOSIS — G8929 Other chronic pain: Secondary | ICD-10-CM

## 2021-06-01 DIAGNOSIS — R2689 Other abnormalities of gait and mobility: Secondary | ICD-10-CM

## 2021-06-01 DIAGNOSIS — M6281 Muscle weakness (generalized): Secondary | ICD-10-CM

## 2021-06-01 DIAGNOSIS — R600 Localized edema: Secondary | ICD-10-CM

## 2021-06-01 NOTE — Therapy (Signed)
OUTPATIENT PHYSICAL THERAPY TREATMENT NOTE   Patient Name: Carlos Aguirre MRN: [id number] DOB:Oct 30, 1970, 51 y.o., male Today's Date: 06/01/2021  PCP: Oneita Hurt, No REFERRING PROVIDER: Cristie Hem, PA-C   PT End of Session - 06/01/21 1123     Visit Number 12    Number of Visits 17    Date for PT Re-Evaluation 06/25/21    Authorization Type [employer]    PT Start Time 1120    PT Stop Time 1205    PT Time Calculation (min) 45 min    Activity Tolerance Patient tolerated treatment well    Behavior During Therapy WFL for tasks assessed/performed                      Past Medical History:  Diagnosis Date   Complication of anesthesia    Hard to wake up per patient   Past Surgical History:  Procedure Laterality Date   HERNIA REPAIR     KNEE SURGERY     SHOULDER SURGERY     TOTAL KNEE ARTHROPLASTY Left 04/02/2021   Procedure: LEFT TOTAL KNEE ARTHROPLASTY;  Surgeon: Tarry Kos, MD;  Location: [facility];  Service: Orthopedics;  Laterality: Left;   Patient Active Problem List   Diagnosis Date Noted   Arthrofibrosis of knee joint, left 05/16/2021   Status post total knee replacement, left 04/02/2021   Primary osteoarthritis of left knee 02/02/2021    REFERRING DIAG: Hx of total knee replacement, left  THERAPY DIAG:  Chronic pain of left knee  Muscle weakness (generalized)  Other abnormalities of gait and mobility  Localized edema  PERTINENT HISTORY: None  PRECAUTIONS: None   SUBJECTIVE: Patient reports he was able to reach 94 deg on the CPM last night after therapy. He continues to have more stiffness in the morning which makes it difficult to walk.  PAIN:  Are you having pain? Yes NPRS scale: 3/10 Pain location: Knee Pain orientation: Left  PAIN TYPE: Surgical Pain description: Constant, stiff Aggravating factors: Moving the knee Relieving factors: Cold pack, rest, elevation    PATIENT GOALS Pain relief and get back to work without  limitation     OBJECTIVE: (BOLDED MEASURE ASSESSED THIS VISIT) PATIENT SURVEYS:  FOTO 53% functional status     EDEMA   Right: 42 cm, Left: 45 cm  LE AROM/PROM:   AROM Left 05/28/2021 Left 05/29/2021 Left 05/30/2021 Left 05/31/2021  Knee flexion 81 (82 seated) 84 (seated) 80 supine / 84 seated 84 (seated)  Knee extension   -8 -8    PROM Left 04/30/2021 Left 05/25/2021 Left 05/28/2021 Left 05/30/2021  Knee flexion 65 88 90 88  Knee extension      -12         -7          -6    LE MMT:   MMT Right 04/30/2021 Left 04/30/2021  Hip flexion 4 4  Hip extension 4- 4-  Hip abduction 4- 4-  Knee flexion 5 4-  Knee extension 5 4-    Patient able to perform SLR without extension lag this visit (05/15/2021)   TODAY'S TREATMENT 06/01/2021: Therapeutic Exercise: Recumbent bike partial revolutions x 5 min to improve knee flexion, while taking subjective Supine heel prop with 10# x 5 min Quad set with heel prop 5 x 5 sec, with manual overpressure 5 x 5 sec SLR x 10 Supine knee AROM with feet on physioball 5 x 5 sec each Seated knee flexion/extension AROM multiple  bouts following passive knee stretching Manual Therapy: IASTM to quad, hamstring, and calf Seated and supine tib-fem mobs to improve knee flexion and extension, belt with sustained traction mob in seated with knee flexion Seated and supine knee PROM for flexion and extension Supine patellofemoral mobs all directions   05/31/2021: Therapeutic Exercise: Recumbent bike partial revolutions x 6 min to improve knee flexion, while taking subjective Supine heel prop with 10# x 5 min with MHP applied to posterior knee Quad set with heel prop 5 x 5 sec, with manual overpressure 5 x 5 sec Supine heel slide AROM multiple bouts following passive knee stretching Seated knee flexion/extension AROM multiple bouts following passive knee stretching TRX squat through full range 5 x 5 sec hold Manual Therapy: Seated and supine tib-fem mobs  to improve knee flexion and extension, belt with sustained traction mob in seated with knee flexion Seated and supine knee PROM for flexion and extension Supine patellofemoral mobs all directions  05/30/2021: Therapeutic Exercise: Recumbent bike partial revolutions x 6 min to improve knee flexion, while taking subjective Seated knee flexion/extension AROM multiple bouts following passive knee stretching Supine heel prop with 10# x 5 min Supine heel slide AROM multiple bouts following passive knee stretching Standing knee flexion stretch at step 5 x 10 sec hold TRX squat through full range 5 x 5 sec hold Manual Therapy: Seated and supine tib-fem mobs to improve knee flexion and extension, belt with sustained traction mob in seated with knee flexion Seated and supine knee PROM for flexion and extension Supine patellofemoral mobs all directions Seated PNF contract-relax stretch for knee flexion   PATIENT EDUCATION:  Education details: HEP Person educated: Patient Education method: Programmer, multimedia, Facilities manager, Actor cues, Verbal cues Education comprehension: verbalized understanding, returned demonstration, verbal cues required, tactile cues required, and needs further education   HOME EXERCISE PROGRAM: Access Code: [email or web address]     ASSESSMENT: CLINICAL IMPRESSION: Patient tolerated therapy well with no adverse effects. Therapy manly focused on manual this visit with use of IASTM to reduce limitation in knee motion due to muscular restriction. Overall patient continues to progress with therapy but remains limitation with knee motion. Patient will return to frequency of 2x/week beginning next week. No changes to HEP this visit. Patient would benefit from continued skilled PT to progress mobility and strength in order to reduce pain and maximize functional ability.  Objective impairments include Abnormal gait, decreased activity tolerance, decreased balance, difficulty walking, decreased ROM,  decreased strength, and pain.     GOALS: Goals reviewed with patient? Yes   SHORT TERM GOALS:   STG Name Target Date Goal status  1 Patient will be I with initial HEP in order to progress with therapy. Baseline: progressing with HEP 05/28/2021 ONGOING  2 PT will review FOTO with patient by 3rd visit in order to understand expected progress and outcome with therapy. Baseline: reviewed with patient 05/28/2021 ONGOING  3 Patient will demonstrate active knee motion 5-90 deg in order to improve gait Baseline: 8-75 deg 05/28/2021 ONGOING  4 Patient will ambulate community level without AD to improve access and return to work without limitations Baseline: patient ambulating without assistive device 05/28/2021 ACHIEVED    LONG TERM GOALS:    LTG Name Target Date Goal status  1 Patient will be I with final HEP to maintain progress from PT. Baseline: provided at eval 06/25/2021 INITIAL  2 Patient will report >/= 66% status on FOTO to indicate improved functional ability. Baseline: 53% 06/25/2021 INITIAL  3 Patient will  demonstrate active knee motion 2-120 deg to all normal movement mechanics with transfers and stairs Baseline: 15-60 deg at eval 06/25/2021 INITIAL  4 Patient will exhibit knee strength 5/5 MMT to improve standing tolerance reduce pain with activity Baseline: grossly 4-/5 in available range, extension lag with SLR 06/25/2021 INITIAL  5 Patient will report pain </= 2/10 with all standing and walking activity to maximize functional ability and allow full return to work Baseline: 7/10 06/25/2021 INITIAL      PLAN: PT FREQUENCY: 2x/week   PT DURATION: 8 weeks   PLANNED INTERVENTIONS: Therapeutic exercises, Therapeutic activity, Neuro Muscular re-education, Balance training, Gait training, Patient/Family education, Joint mobilization, Stair training, Aquatic Therapy, Dry Needling, Cryotherapy, Moist heat, Taping, Vasopneumatic device, and Manual therapy   PLAN FOR NEXT SESSION: Review  HEP and progress PRN, manual/stretching for knee flexion and extension, progress strength to closed chain as tolerated, vaso post session, balance and gait training.     Rosana Hoes, PT, DPT, LAT, ATC 06/01/21  12:09 PM Phone: [phone] Fax: [phone]

## 2021-06-05 ENCOUNTER — Other Ambulatory Visit: Payer: Self-pay

## 2021-06-05 ENCOUNTER — Ambulatory Visit: Payer: PRIVATE HEALTH INSURANCE | Admitting: Physical Therapy

## 2021-06-05 ENCOUNTER — Encounter: Payer: Self-pay | Admitting: Physical Therapy

## 2021-06-05 DIAGNOSIS — G8929 Other chronic pain: Secondary | ICD-10-CM

## 2021-06-05 DIAGNOSIS — R2689 Other abnormalities of gait and mobility: Secondary | ICD-10-CM

## 2021-06-05 DIAGNOSIS — M25562 Pain in left knee: Secondary | ICD-10-CM | POA: Diagnosis not present

## 2021-06-05 DIAGNOSIS — M6281 Muscle weakness (generalized): Secondary | ICD-10-CM

## 2021-06-05 DIAGNOSIS — R600 Localized edema: Secondary | ICD-10-CM

## 2021-06-05 NOTE — Therapy (Signed)
OUTPATIENT PHYSICAL THERAPY TREATMENT NOTE   Patient Name: Carlos Aguirre MRN: [id number] DOB:Oct 04, 1970, 51 y.o., male Today's Date: 06/05/2021  PCP: Merryl Hacker, No REFERRING PROVIDER: Aundra Dubin, PA-C   PT End of Session - 06/05/21 1534     Visit Number 13    Number of Visits 17    Date for PT Re-Evaluation 06/25/21    Authorization Type [employer]    PT Start Time 1530    PT Stop Time 1615    PT Time Calculation (min) 45 min    Activity Tolerance Patient tolerated treatment well    Behavior During Therapy WFL for tasks assessed/performed                       Past Medical History:  Diagnosis Date   Complication of anesthesia    Hard to wake up per patient   Past Surgical History:  Procedure Laterality Date   HERNIA REPAIR     KNEE SURGERY     SHOULDER SURGERY     TOTAL KNEE ARTHROPLASTY Left 04/02/2021   Procedure: LEFT TOTAL KNEE ARTHROPLASTY;  Surgeon: Leandrew Koyanagi, MD;  Location: [place];  Service: Orthopedics;  Laterality: Left;   Patient Active Problem List   Diagnosis Date Noted   Arthrofibrosis of knee joint, left 05/16/2021   Status post total knee replacement, left 04/02/2021   Primary osteoarthritis of left knee 02/02/2021    REFERRING DIAG: Hx of total knee replacement, left  THERAPY DIAG:  Chronic pain of left knee  Muscle weakness (generalized)  Other abnormalities of gait and mobility  Localized edema  PERTINENT HISTORY: None  PRECAUTIONS: None   SUBJECTIVE: Patient reports he is feeling stiff today but he was able to get to 97 on the CPM over the weekend.   PAIN:  Are you having pain? Yes NPRS scale: 4/10 Pain location: Knee Pain orientation: Left  PAIN TYPE: Surgical Pain description: Constant, stiff Aggravating factors: Moving the knee Relieving factors: Cold pack, rest, elevation    PATIENT GOALS Pain relief and get back to work without limitation     OBJECTIVE: (BOLDED MEASURE ASSESSED THIS  VISIT) PATIENT SURVEYS:  FOTO 53% functional status     EDEMA   Right: 42 cm, Left: 45 cm  LE AROM/PROM:   AROM Left 05/28/2021 Left 05/29/2021 Left 05/30/2021 Left 05/31/2021  Knee flexion 81 (82 seated) 84 (seated) 80 supine / 84 seated 84 (seated)  Knee extension   -8 -8    PROM Left 04/30/2021 Left 05/25/2021 Left 05/28/2021 Left 05/30/2021  Knee flexion 65 88 90 88  Knee extension      -12         -7          -6    LE MMT:   MMT Right 04/30/2021 Left 04/30/2021  Hip flexion 4 4  Hip extension 4- 4-  Hip abduction 4- 4-  Knee flexion 5 4-  Knee extension 5 4-    Patient able to perform SLR without extension lag this visit (05/15/2021)   TODAY'S TREATMENT 06/05/2021: Therapeutic Exercise: Recumbent bike partial revolutions x 5 min to improve knee flexion, while taking subjective Seated knee flexion/extension AROM multiple bouts following passive knee stretching Supine knee AROM with feet on physioball 5 x 5 sec each Slant board calf stretch 3 x 30 sec Manual Therapy: IASTM to distal quad and incision with patient in seated and knee flexed Supine patellofemoral mobs all directions Seated and  supine tib-fem mobs to improve knee flexion and extension Seated and supine knee PROM for flexion and extension   06/01/2021: Therapeutic Exercise: Recumbent bike partial revolutions x 5 min to improve knee flexion, while taking subjective Supine heel prop with 10# x 5 min Quad set with heel prop 5 x 5 sec, with manual overpressure 5 x 5 sec SLR x 10 Supine knee AROM with feet on physioball 5 x 5 sec each Seated knee flexion/extension AROM multiple bouts following passive knee stretching Manual Therapy: IASTM to quad, hamstring, and calf Seated and supine tib-fem mobs to improve knee flexion and extension, belt with sustained traction mob in seated with knee flexion Seated and supine knee PROM for flexion and extension Supine patellofemoral mobs all  directions  05/31/2021: Therapeutic Exercise: Recumbent bike partial revolutions x 6 min to improve knee flexion, while taking subjective Supine heel prop with 10# x 5 min with MHP applied to posterior knee Quad set with heel prop 5 x 5 sec, with manual overpressure 5 x 5 sec Supine heel slide AROM multiple bouts following passive knee stretching Seated knee flexion/extension AROM multiple bouts following passive knee stretching TRX squat through full range 5 x 5 sec hold Manual Therapy: Seated and supine tib-fem mobs to improve knee flexion and extension, belt with sustained traction mob in seated with knee flexion Seated and supine knee PROM for flexion and extension Supine patellofemoral mobs all directions   PATIENT EDUCATION:  Education details: HEP Person educated: Patient Education method: Consulting civil engineer, Media planner, Corporate treasurer cues, Verbal cues Education comprehension: verbalized understanding, returned demonstration, verbal cues required, tactile cues required, and needs further education   HOME EXERCISE PROGRAM: Access Code: [email or web address]     ASSESSMENT: CLINICAL IMPRESSION: Patient tolerated therapy well with no adverse effects. Therapy continues to focus on progressing knee motion. Patient did report increase in medial knee pain this visit that limited progression of knee flexion. Did not measure his range of motion this visit. Patient does require cueing for knee flexion/extension with gait. Patient would benefit from continued skilled PT to progress mobility and strength in order to reduce pain and maximize functional ability.  Objective impairments include Abnormal gait, decreased activity tolerance, decreased balance, difficulty walking, decreased ROM, decreased strength, and pain.     GOALS: Goals reviewed with patient? Yes   SHORT TERM GOALS:   STG Name Target Date Goal status  1 Patient will be I with initial HEP in order to progress with therapy. Baseline:  progressing with HEP 05/28/2021 ONGOING  2 PT will review FOTO with patient by 3rd visit in order to understand expected progress and outcome with therapy. Baseline: reviewed with patient 05/28/2021 ONGOING  3 Patient will demonstrate active knee motion 5-90 deg in order to improve gait Baseline: 8-75 deg 05/28/2021 ONGOING  4 Patient will ambulate community level without AD to improve access and return to work without limitations Baseline: patient ambulating without assistive device 05/28/2021 ACHIEVED    LONG TERM GOALS:    LTG Name Target Date Goal status  1 Patient will be I with final HEP to maintain progress from PT. Baseline: provided at eval 06/25/2021 INITIAL  2 Patient will report >/= 66% status on FOTO to indicate improved functional ability. Baseline: 53% 06/25/2021 INITIAL  3 Patient will demonstrate active knee motion 2-120 deg to all normal movement mechanics with transfers and stairs Baseline: 15-60 deg at eval 06/25/2021 INITIAL  4 Patient will exhibit knee strength 5/5 MMT to improve standing tolerance reduce pain with activity  Baseline: grossly 4-/5 in available range, extension lag with SLR 06/25/2021 INITIAL  5 Patient will report pain </= 2/10 with all standing and walking activity to maximize functional ability and allow full return to work Baseline: 7/10 06/25/2021 INITIAL      PLAN: PT FREQUENCY: 2x/week   PT DURATION: 8 weeks   PLANNED INTERVENTIONS: Therapeutic exercises, Therapeutic activity, Neuro Muscular re-education, Balance training, Gait training, Patient/Family education, Joint mobilization, Stair training, Aquatic Therapy, Dry Needling, Cryotherapy, Moist heat, Taping, Vasopneumatic device, and Manual therapy   PLAN FOR NEXT SESSION: Review HEP and progress PRN, manual/stretching for knee flexion and extension, progress strength to closed chain as tolerated, vaso post session, balance and gait training.     Hilda Blades, PT, DPT, LAT, ATC 06/05/21  4:17  PM Phone: [phone] Fax: [phone]

## 2021-06-07 ENCOUNTER — Ambulatory Visit: Payer: PRIVATE HEALTH INSURANCE | Admitting: Physical Therapy

## 2021-06-07 NOTE — Therapy (Incomplete)
OUTPATIENT PHYSICAL THERAPY TREATMENT NOTE   Patient Name: Carlos Aguirre MRN: [id number] DOB:Aug 28, 1970, 51 y.o., male Today's Date: 06/07/2021  PCP: Merryl Hacker, No REFERRING PROVIDER: Nathaniel Man               Past Medical History:  Diagnosis Date   Complication of anesthesia    Hard to wake up per patient   Past Surgical History:  Procedure Laterality Date   [place] Left 04/02/2021   Procedure: LEFT TOTAL KNEE ARTHROPLASTY;  Surgeon: Leandrew Koyanagi, MD;  Location: [facility];  Service: Orthopedics;  Laterality: Left;   Patient Active Problem List   Diagnosis Date Noted   Arthrofibrosis of knee joint, left 05/16/2021   Status post total knee replacement, left 04/02/2021   Primary osteoarthritis of left knee 02/02/2021    REFERRING DIAG: Hx of total knee replacement, left  THERAPY DIAG:  No diagnosis found.  PERTINENT HISTORY: None  PRECAUTIONS: None   SUBJECTIVE: Patient reports he is feeling stiff today but he was able to get to 97 on the CPM over the weekend.   PAIN:  Are you having pain? Yes NPRS scale: 4/10 Pain location: Knee Pain orientation: Left  PAIN TYPE: Surgical Pain description: Constant, stiff Aggravating factors: Moving the knee Relieving factors: Cold pack, rest, elevation    PATIENT GOALS Pain relief and get back to work without limitation     OBJECTIVE: (BOLDED MEASURE ASSESSED THIS VISIT) PATIENT SURVEYS:  FOTO 53% functional status     EDEMA   Right: 42 cm, Left: 45 cm  LE AROM/PROM:   AROM Left 05/28/2021 Left 05/29/2021 Left 05/30/2021 Left 05/31/2021  Knee flexion 81 (82 seated) 84 (seated) 80 supine / 84 seated 84 (seated)  Knee extension   -8 -8    PROM Left 04/30/2021 Left 05/25/2021 Left 05/28/2021 Left 05/30/2021  Knee flexion 65 88 90 88  Knee extension      -12         -7          -6    LE MMT:   MMT Right 04/30/2021  Left 04/30/2021  Hip flexion 4 4  Hip extension 4- 4-  Hip abduction 4- 4-  Knee flexion 5 4-  Knee extension 5 4-    Patient able to perform SLR without extension lag this visit (05/15/2021)   TODAY'S TREATMENT 06/05/2021: Therapeutic Exercise: Recumbent bike partial revolutions x 5 min to improve knee flexion, while taking subjective Seated knee flexion/extension AROM multiple bouts following passive knee stretching Supine knee AROM with feet on physioball 5 x 5 sec each Slant board calf stretch 3 x 30 sec Manual Therapy: IASTM to distal quad and incision with patient in seated and knee flexed Supine patellofemoral mobs all directions Seated and supine tib-fem mobs to improve knee flexion and extension Seated and supine knee PROM for flexion and extension   06/01/2021: Therapeutic Exercise: Recumbent bike partial revolutions x 5 min to improve knee flexion, while taking subjective Supine heel prop with 10# x 5 min Quad set with heel prop 5 x 5 sec, with manual overpressure 5 x 5 sec SLR x 10 Supine knee AROM with feet on physioball 5 x 5 sec each Seated knee flexion/extension AROM multiple bouts following passive knee stretching Manual Therapy: IASTM to quad, hamstring, and calf Seated and supine tib-fem mobs to improve knee flexion and  extension, belt with sustained traction mob in seated with knee flexion Seated and supine knee PROM for flexion and extension Supine patellofemoral mobs all directions  05/31/2021: Therapeutic Exercise: Recumbent bike partial revolutions x 6 min to improve knee flexion, while taking subjective Supine heel prop with 10# x 5 min with MHP applied to posterior knee Quad set with heel prop 5 x 5 sec, with manual overpressure 5 x 5 sec Supine heel slide AROM multiple bouts following passive knee stretching Seated knee flexion/extension AROM multiple bouts following passive knee stretching TRX squat through full range 5 x 5 sec hold Manual  Therapy: Seated and supine tib-fem mobs to improve knee flexion and extension, belt with sustained traction mob in seated with knee flexion Seated and supine knee PROM for flexion and extension Supine patellofemoral mobs all directions   PATIENT EDUCATION:  Education details: HEP Person educated: Patient Education method: Consulting civil engineer, Media planner, Corporate treasurer cues, Verbal cues Education comprehension: verbalized understanding, returned demonstration, verbal cues required, tactile cues required, and needs further education   HOME EXERCISE PROGRAM: Access Code: [email or web address]     ASSESSMENT: CLINICAL IMPRESSION: Patient tolerated therapy well with no adverse effects. Therapy continues to focus on progressing knee motion. Patient did report increase in medial knee pain this visit that limited progression of knee flexion. Did not measure his range of motion this visit. Patient does require cueing for knee flexion/extension with gait. Patient would benefit from continued skilled PT to progress mobility and strength in order to reduce pain and maximize functional ability.  Objective impairments include Abnormal gait, decreased activity tolerance, decreased balance, difficulty walking, decreased ROM, decreased strength, and pain.     GOALS: Goals reviewed with patient? Yes   SHORT TERM GOALS:   STG Name Target Date Goal status  1 Patient will be I with initial HEP in order to progress with therapy. Baseline: progressing with HEP 05/28/2021 ONGOING  2 PT will review FOTO with patient by 3rd visit in order to understand expected progress and outcome with therapy. Baseline: reviewed with patient 05/28/2021 ONGOING  3 Patient will demonstrate active knee motion 5-90 deg in order to improve gait Baseline: 8-75 deg 05/28/2021 ONGOING  4 Patient will ambulate community level without AD to improve access and return to work without limitations Baseline: patient ambulating without assistive device 05/28/2021  ACHIEVED    LONG TERM GOALS:    LTG Name Target Date Goal status  1 Patient will be I with final HEP to maintain progress from PT. Baseline: provided at eval 06/25/2021 INITIAL  2 Patient will report >/= 66% status on FOTO to indicate improved functional ability. Baseline: 53% 06/25/2021 INITIAL  3 Patient will demonstrate active knee motion 2-120 deg to all normal movement mechanics with transfers and stairs Baseline: 15-60 deg at eval 06/25/2021 INITIAL  4 Patient will exhibit knee strength 5/5 MMT to improve standing tolerance reduce pain with activity Baseline: grossly 4-/5 in available range, extension lag with SLR 06/25/2021 INITIAL  5 Patient will report pain </= 2/10 with all standing and walking activity to maximize functional ability and allow full return to work Baseline: 7/10 06/25/2021 INITIAL      PLAN: PT FREQUENCY: 2x/week   PT DURATION: 8 weeks   PLANNED INTERVENTIONS: Therapeutic exercises, Therapeutic activity, Neuro Muscular re-education, Balance training, Gait training, Patient/Family education, Joint mobilization, Stair training, Aquatic Therapy, Dry Needling, Cryotherapy, Moist heat, Taping, Vasopneumatic device, and Manual therapy   PLAN FOR NEXT SESSION: Review HEP and progress PRN, manual/stretching for knee flexion  and extension, progress strength to closed chain as tolerated, vaso post session, balance and gait training.     Hilda Blades, PT, DPT, LAT, ATC 06/07/21  9:54 AM Phone: [phone] Fax: [phone]

## 2021-06-08 ENCOUNTER — Encounter: Payer: Self-pay | Admitting: Physical Therapy

## 2021-06-08 ENCOUNTER — Other Ambulatory Visit: Payer: Self-pay

## 2021-06-08 ENCOUNTER — Ambulatory Visit: Payer: PRIVATE HEALTH INSURANCE | Admitting: Physical Therapy

## 2021-06-08 DIAGNOSIS — G8929 Other chronic pain: Secondary | ICD-10-CM

## 2021-06-08 DIAGNOSIS — M25562 Pain in left knee: Secondary | ICD-10-CM | POA: Diagnosis not present

## 2021-06-08 DIAGNOSIS — M6281 Muscle weakness (generalized): Secondary | ICD-10-CM

## 2021-06-08 DIAGNOSIS — R2689 Other abnormalities of gait and mobility: Secondary | ICD-10-CM

## 2021-06-08 DIAGNOSIS — R600 Localized edema: Secondary | ICD-10-CM

## 2021-06-08 NOTE — Therapy (Signed)
OUTPATIENT PHYSICAL THERAPY TREATMENT NOTE   Patient Name: Carlos Aguirre MRN: [id number] DOB:April 05, 1971, 51 y.o., male Today's Date: 06/08/2021  PCP: Oneita Hurt, No REFERRING PROVIDER: Cristie Hem, PA-C   PT End of Session - 06/08/21 0818     Visit Number 14    Number of Visits 17    Date for PT Re-Evaluation 06/25/21    Authorization Type [employer]    Progress Note Due on Visit --    PT Start Time 0825    PT Stop Time 0910    PT Time Calculation (min) 45 min    Activity Tolerance Patient tolerated treatment well    Behavior During Therapy WFL for tasks assessed/performed                        Past Medical History:  Diagnosis Date   Complication of anesthesia    Hard to wake up per patient   Past Surgical History:  Procedure Laterality Date   HERNIA REPAIR     KNEE SURGERY     SHOULDER SURGERY     TOTAL KNEE ARTHROPLASTY Left 04/02/2021   Procedure: LEFT TOTAL KNEE ARTHROPLASTY;  Surgeon: Tarry Kos, MD;  Location: [facility];  Service: Orthopedics;  Laterality: Left;   Patient Active Problem List   Diagnosis Date Noted   Arthrofibrosis of knee joint, left 05/16/2021   Status post total knee replacement, left 04/02/2021   Primary osteoarthritis of left knee 02/02/2021    REFERRING DIAG: Hx of total knee replacement, left  THERAPY DIAG:  Chronic pain of left knee  Muscle weakness (generalized)  Other abnormalities of gait and mobility  Localized edema  PERTINENT HISTORY: None  PRECAUTIONS: None   SUBJECTIVE: Patient reports he is doing well, he missed last visit yesterday due to having an emergency at home. He went to a concert last night and had to go up/down more steps which caused more pain and soreness.  PAIN:  Are you having pain? Yes NPRS scale: 6/10 Pain location: Knee Pain orientation: Left  PAIN TYPE: Surgical Pain description: Constant, stiff Aggravating factors: Moving the knee Relieving factors: Cold pack, rest,  elevation    PATIENT GOALS Pain relief and get back to work without limitation     OBJECTIVE: (BOLDED MEASURE ASSESSED THIS VISIT) PATIENT SURVEYS:  FOTO 53% functional status     EDEMA   Right: 42 cm, Left: 45 cm  LE AROM/PROM:   AROM Left 05/31/2021 Left 06/08/2021  Knee flexion 84 (seated) -  Knee extension -8 -7    PROM Left 04/30/2021 Left 05/25/2021 Left 05/28/2021 Left 05/30/2021  Knee flexion 65 88 90 88  Knee extension      -12         -7          -6    LE MMT:   MMT Right 04/30/2021 Left 04/30/2021  Hip flexion 4 4  Hip extension 4- 4-  Hip abduction 4- 4-  Knee flexion 5 4-  Knee extension 5 4-    Patient able to perform SLR without extension lag this visit (05/15/2021)   TODAY'S TREATMENT 06/08/2021: Therapeutic Exercise: Recumbent bike partial revolutions x 5 min to improve knee flexion, while taking subjective Seated knee flexion/extension AROM multiple bouts following passive knee stretching - had to discontinue due to patient reporting feeling lightheaded Supine knee AROM with feet on physioball 5 x 5 sec each Supine heel prop with MHP pack placed under knee  x Manual Therapy: Supine patellofemoral mobs all directions Seated and supine tib-fem mobs to improve knee flexion and extension Seated and supine knee PROM for flexion and extension   06/05/2021: Therapeutic Exercise: Recumbent bike partial revolutions x 5 min to improve knee flexion, while taking subjective Seated knee flexion/extension AROM multiple bouts following passive knee stretching Supine knee AROM with feet on physioball 5 x 5 sec each Slant board calf stretch 3 x 30 sec Manual Therapy: IASTM to distal quad and incision with patient in seated and knee flexed Supine patellofemoral mobs all directions Seated and supine tib-fem mobs to improve knee flexion and extension Seated and supine knee PROM for flexion and extension  06/01/2021: Therapeutic Exercise: Recumbent bike  partial revolutions x 5 min to improve knee flexion, while taking subjective Supine heel prop with 10# x 5 min Quad set with heel prop 5 x 5 sec, with manual overpressure 5 x 5 sec SLR x 10 Supine knee AROM with feet on physioball 5 x 5 sec each Seated knee flexion/extension AROM multiple bouts following passive knee stretching Manual Therapy: IASTM to quad, hamstring, and calf Seated and supine tib-fem mobs to improve knee flexion and extension, belt with sustained traction mob in seated with knee flexion Seated and supine knee PROM for flexion and extension Supine patellofemoral mobs all directions   PATIENT EDUCATION:  Education details: HEP Person educated: Patient Education method: Programmer, multimedia, Facilities manager, Actor cues, Verbal cues Education comprehension: verbalized understanding, returned demonstration, verbal cues required, tactile cues required, and needs further education   HOME EXERCISE PROGRAM: Access Code: [email or web address]     ASSESSMENT: CLINICAL IMPRESSION: Patient tolerated therapy well with no adverse effects. Therapy continues to focus on progressing knee motion. He continues to demonstrate limitation in knee motion. Patient reported feeling lightheaded with knee flexion stretching this visit so was not as aggressive with stretching and performed more supine stretching this visit. Patient reported he negotiated more steps last night which is likely why is his knee was more sore this visit. Patient would benefit from continued skilled PT to progress mobility and strength in order to reduce pain and maximize functional ability.  Objective impairments include Abnormal gait, decreased activity tolerance, decreased balance, difficulty walking, decreased ROM, decreased strength, and pain.     GOALS: Goals reviewed with patient? Yes   SHORT TERM GOALS:   STG Name Target Date Goal status  1 Patient will be I with initial HEP in order to progress with therapy. Baseline:  progressing with HEP 05/28/2021 ONGOING  2 PT will review FOTO with patient by 3rd visit in order to understand expected progress and outcome with therapy. Baseline: reviewed with patient 05/28/2021 ONGOING  3 Patient will demonstrate active knee motion 5-90 deg in order to improve gait Baseline: 8-75 deg 05/28/2021 ONGOING  4 Patient will ambulate community level without AD to improve access and return to work without limitations Baseline: patient ambulating without assistive device 05/28/2021 ACHIEVED    LONG TERM GOALS:    LTG Name Target Date Goal status  1 Patient will be I with final HEP to maintain progress from PT. Baseline: provided at eval 06/25/2021 INITIAL  2 Patient will report >/= 66% status on FOTO to indicate improved functional ability. Baseline: 53% 06/25/2021 INITIAL  3 Patient will demonstrate active knee motion 2-120 deg to all normal movement mechanics with transfers and stairs Baseline: 15-60 deg at eval 06/25/2021 INITIAL  4 Patient will exhibit knee strength 5/5 MMT to improve standing tolerance reduce pain  with activity Baseline: grossly 4-/5 in available range, extension lag with SLR 06/25/2021 INITIAL  5 Patient will report pain </= 2/10 with all standing and walking activity to maximize functional ability and allow full return to work Baseline: 7/10 06/25/2021 INITIAL      PLAN: PT FREQUENCY: 2x/week   PT DURATION: 8 weeks   PLANNED INTERVENTIONS: Therapeutic exercises, Therapeutic activity, Neuro Muscular re-education, Balance training, Gait training, Patient/Family education, Joint mobilization, Stair training, Aquatic Therapy, Dry Needling, Cryotherapy, Moist heat, Taping, Vasopneumatic device, and Manual therapy   PLAN FOR NEXT SESSION: Review HEP and progress PRN, manual/stretching for knee flexion and extension, progress strength to closed chain as tolerated, vaso post session, balance and gait training.     Rosana Hoes, PT, DPT, LAT, ATC 06/08/21  9:18  AM Phone: [phone] Fax: [phone]

## 2021-06-11 NOTE — Therapy (Signed)
OUTPATIENT PHYSICAL THERAPY TREATMENT NOTE   Patient Name: Carlos Aguirre MRN: [id number] DOB:22-Sep-1970, 51 y.o., male Today's Date: 06/12/2021  PCP: Carlos Aguirre, No REFERRING PROVIDER: Cristie Hem, PA-C   PT End of Session - 06/12/21 1516     Visit Number 15    Number of Visits 17    Date for PT Re-Evaluation 06/25/21    Authorization Type [employer]    Progress Note Due on Visit 10    PT Start Time 1520    PT Stop Time 1605    PT Time Calculation (min) 45 min    Activity Tolerance Patient tolerated treatment well    Behavior During Therapy WFL for tasks assessed/performed                         Past Medical History:  Diagnosis Date   Complication of anesthesia    Hard to wake up per patient   Past Surgical History:  Procedure Laterality Date   HERNIA REPAIR     KNEE SURGERY     SHOULDER SURGERY     TOTAL KNEE ARTHROPLASTY Left 04/02/2021   Procedure: LEFT TOTAL KNEE ARTHROPLASTY;  Surgeon: Carlos Kos, MD;  Location: [facility];  Service: Orthopedics;  Laterality: Left;   Patient Active Problem List   Diagnosis Date Noted   Arthrofibrosis of knee joint, left 05/16/2021   Status post total knee replacement, left 04/02/2021   Primary osteoarthritis of left knee 02/02/2021    REFERRING DIAG: Hx of total knee replacement, left  THERAPY DIAG:  Chronic pain of left knee  Muscle weakness (generalized)  Other abnormalities of gait and mobility  Localized edema  PERTINENT HISTORY: None  PRECAUTIONS: None   SUBJECTIVE: Patient reports he is doing well, he got to 98 deg on the CPM so feels he is improving. He was in his feet for a long time yesterday so had a tough time sleeping last night.  PAIN:  Are you having pain? Yes NPRS scale: 4/10 Pain location: Knee Pain orientation: Left  PAIN TYPE: Surgical Pain description: Constant, stiff Aggravating factors: Moving the knee Relieving factors: Cold pack, rest, elevation    PATIENT GOALS  Pain relief and get back to work without limitation     OBJECTIVE: (BOLDED MEASURE ASSESSED THIS VISIT) PATIENT SURVEYS:  FOTO 53% functional status     EDEMA   Right: 42 cm, Left: 45 cm  LE AROM/PROM:   AROM Left 05/31/2021 Left 06/08/2021 Left 06/12/2021  Knee flexion 84 (seated) -   Knee extension -8 -7 -6    PROM Left 04/30/2021 Left 05/25/2021 Left 05/28/2021 Left 05/30/2021  Knee flexion 65 88 90 88  Knee extension      -12         -7          -6    LE MMT:   MMT Right 04/30/2021 Left 04/30/2021  Hip flexion 4 4  Hip extension 4- 4-  Hip abduction 4- 4-  Knee flexion 5 4-  Knee extension 5 4-    Patient able to perform SLR without extension lag this visit (05/15/2021)   TODAY'S TREATMENT 06/12/2021: Therapeutic Exercise: Recumbent bike partial revolutions x 5 min to improve knee flexion, while taking subjective Slant board calf stretch 3 x 30 sec Modified longsitting hamstring stretch on table 3 x 30 sec Modified thomas stretch with passive knee flexion 3 x 30 sec Seated knee flexion/extension AROM multiple bouts following passive  knee stretching Supine knee AROM with feet on physioball 5 x 5 sec each Leg press (omega) staggered stance 45# 2 x 10 SL knee extension machine x 10 Manual Therapy: Supine patellofemoral mobs all directions Seated and supine tib-fem mobs to improve knee flexion and extension Seated and supine knee PROM for flexion and extension   06/08/2021: Therapeutic Exercise: Recumbent bike partial revolutions x 5 min to improve knee flexion, while taking subjective Seated knee flexion/extension AROM multiple bouts following passive knee stretching - had to discontinue due to patient reporting feeling lightheaded Supine knee AROM with feet on physioball 5 x 5 sec each Supine heel prop with MHP pack placed under knee x Manual Therapy: Supine patellofemoral mobs all directions Seated and supine tib-fem mobs to improve knee flexion and  extension Seated and supine knee PROM for flexion and extension  06/05/2021: Therapeutic Exercise: Recumbent bike partial revolutions x 5 min to improve knee flexion, while taking subjective Seated knee flexion/extension AROM multiple bouts following passive knee stretching Supine knee AROM with feet on physioball 5 x 5 sec each Slant board calf stretch 3 x 30 sec Manual Therapy: IASTM to distal quad and incision with patient in seated and knee flexed Supine patellofemoral mobs all directions Seated and supine tib-fem mobs to improve knee flexion and extension Seated and supine knee PROM for flexion and extension   PATIENT EDUCATION:  Education details: HEP Person educated: Patient Education method: Programmer, multimedia, Facilities manager, Actor cues, Verbal cues Education comprehension: verbalized understanding, returned demonstration, verbal cues required, tactile cues required, and needs further education   HOME EXERCISE PROGRAM: Access Code: [email or web address]     ASSESSMENT: CLINICAL IMPRESSION: Patient tolerated therapy well with no adverse effects. Therapy focus continues on knee mobility and progressing his active motion. Patient does demonstrate continued gradual improvement with knee extension, but with continued limitation in knee flexion, mainly limited by pain and stiffness at this point. Incorporated strengthening with leg press and knee extension machine this visit with good tolerance. No changes to HEP this visit. Patient would benefit from continued skilled PT to progress mobility and strength in order to reduce pain and maximize functional ability.  Objective impairments include Abnormal gait, decreased activity tolerance, decreased balance, difficulty walking, decreased ROM, decreased strength, and pain.     GOALS: Goals reviewed with patient? Yes   SHORT TERM GOALS:   STG Name Target Date Goal status  1 Patient will be I with initial HEP in order to progress with  therapy. Baseline: progressing with HEP 05/28/2021 ONGOING  2 PT will review FOTO with patient by 3rd visit in order to understand expected progress and outcome with therapy. Baseline: reviewed with patient 05/28/2021 ONGOING  3 Patient will demonstrate active knee motion 5-90 deg in order to improve gait Baseline: 8-75 deg 05/28/2021 ONGOING  4 Patient will ambulate community level without AD to improve access and return to work without limitations Baseline: patient ambulating without assistive device 05/28/2021 ACHIEVED    LONG TERM GOALS:    LTG Name Target Date Goal status  1 Patient will be I with final HEP to maintain progress from PT. Baseline: provided at eval 06/25/2021 INITIAL  2 Patient will report >/= 66% status on FOTO to indicate improved functional ability. Baseline: 53% 06/25/2021 INITIAL  3 Patient will demonstrate active knee motion 2-120 deg to all normal movement mechanics with transfers and stairs Baseline: 15-60 deg at eval 06/25/2021 INITIAL  4 Patient will exhibit knee strength 5/5 MMT to improve standing tolerance reduce  pain with activity Baseline: grossly 4-/5 in available range, extension lag with SLR 06/25/2021 INITIAL  5 Patient will report pain </= 2/10 with all standing and walking activity to maximize functional ability and allow full return to work Baseline: 7/10 06/25/2021 INITIAL      PLAN: PT FREQUENCY: 2x/week   PT DURATION: 8 weeks   PLANNED INTERVENTIONS: Therapeutic exercises, Therapeutic activity, Neuro Muscular re-education, Balance training, Gait training, Patient/Family education, Joint mobilization, Stair training, Aquatic Therapy, Dry Needling, Cryotherapy, Moist heat, Taping, Vasopneumatic device, and Manual therapy   PLAN FOR NEXT SESSION: Review HEP and progress PRN, manual/stretching for knee flexion and extension, progress strength to closed chain as tolerated, vaso post session, balance and gait training.     Rosana Hoes, PT, DPT, LAT,  ATC 06/12/21  4:10 PM Phone: [phone] Fax: [phone]

## 2021-06-12 ENCOUNTER — Other Ambulatory Visit: Payer: Self-pay

## 2021-06-12 ENCOUNTER — Ambulatory Visit: Payer: PRIVATE HEALTH INSURANCE | Admitting: Physical Therapy

## 2021-06-12 ENCOUNTER — Encounter: Payer: Self-pay | Admitting: Physical Therapy

## 2021-06-12 DIAGNOSIS — M6281 Muscle weakness (generalized): Secondary | ICD-10-CM

## 2021-06-12 DIAGNOSIS — M25562 Pain in left knee: Secondary | ICD-10-CM

## 2021-06-12 DIAGNOSIS — R2689 Other abnormalities of gait and mobility: Secondary | ICD-10-CM

## 2021-06-12 DIAGNOSIS — R600 Localized edema: Secondary | ICD-10-CM

## 2021-06-12 DIAGNOSIS — G8929 Other chronic pain: Secondary | ICD-10-CM

## 2021-06-12 NOTE — Therapy (Incomplete)
?OUTPATIENT PHYSICAL THERAPY TREATMENT NOTE ? ? ?Patient Name: Carlos Aguirre ?MRN: [id number] ?DOB:06-Nov-1970, 51 y.o., male ?Today's Date: 06/12/2021 ? ?PCP: Pcp, No ?REFERRING PROVIDER: Cristie Hem, PA-C ? ? ? ? ? ? ? ? ? ? ? ? ? ? ? ? ?Past Medical History:  ?Diagnosis Date  ? Complication of anesthesia   ? Hard to wake up per patient  ? ?Past Surgical History:  ?Procedure Laterality Date  ? HERNIA REPAIR    ? KNEE SURGERY    ? SHOULDER SURGERY    ? TOTAL KNEE ARTHROPLASTY Left 04/02/2021  ? Procedure: LEFT TOTAL KNEE ARTHROPLASTY;  Surgeon: Tarry Kos, MD;  Location: [facility];  Service: Orthopedics;  Laterality: Left;  ? ?Patient Active Problem List  ? Diagnosis Date Noted  ? Arthrofibrosis of knee joint, left 05/16/2021  ? Status post total knee replacement, left 04/02/2021  ? Primary osteoarthritis of left knee 02/02/2021  ? ? ?REFERRING DIAG: Hx of total knee replacement, left ? ?THERAPY DIAG:  ?No diagnosis found. ? ?PERTINENT HISTORY: None ? ?PRECAUTIONS: None ? ? ?SUBJECTIVE: Patient reports he is doing well, he got to 98 deg on the CPM so feels he is improving. He was in his feet for a long time yesterday so had a tough time sleeping last night. ? ?PAIN:  ?Are you having pain? Yes ?NPRS scale: 4/10 ?Pain location: Knee ?Pain orientation: Left  ?PAIN TYPE: Surgical ?Pain description: Constant, stiff ?Aggravating factors: Moving the knee ?Relieving factors: Cold pack, rest, elevation ? ?  ?PATIENT GOALS Pain relief and get back to work without limitation ?  ?  ?OBJECTIVE: (BOLDED MEASURE ASSESSED THIS VISIT) ?PATIENT SURVEYS:  ?FOTO 53% functional status ?  ?  EDEMA ?  Right: 42 cm, Left: 45 cm ? ?LE AROM/PROM: ?  ?AROM Left ?05/31/2021 Left ?06/08/2021 Left ?06/12/2021  ?Knee flexion 84 (seated) -   ?Knee extension -8 -7 -6  ?  ?PROM Left ?04/30/2021 Left ?05/25/2021 Left ?05/28/2021 Left ?05/30/2021  ?Knee flexion 65 88 90 88  ?Knee extension      -12         -7          -6  ?  ?LE MMT: ?  ?MMT  Right ?04/30/2021 Left ?04/30/2021  ?Hip flexion 4 4  ?Hip extension 4- 4-  ?Hip abduction 4- 4-  ?Knee flexion 5 4-  ?Knee extension 5 4-  ?  ?Patient able to perform SLR without extension lag this visit (05/15/2021) ? ? ?TODAY'S TREATMENT ?06/14/2021: ?Therapeutic Exercise: ?Recumbent bike partial revolutions x 5 min to improve knee flexion, while taking subjective ?Slant board calf stretch 3 x 30 sec ?Modified longsitting hamstring stretch on table 3 x 30 sec ?Modified thomas stretch with passive knee flexion 3 x 30 sec ?Seated knee flexion/extension AROM multiple bouts following passive knee stretching ?Supine knee AROM with feet on physioball 5 x 5 sec each ?Leg press (omega) staggered stance 45# 2 x 10 ?SL knee extension machine x 10 ?Manual Therapy: ?Supine patellofemoral mobs all directions ?Seated and supine tib-fem mobs to improve knee flexion and extension ?Seated and supine knee PROM for flexion and extension ? ? ?06/12/2021: ?Therapeutic Exercise: ?Recumbent bike partial revolutions x 5 min to improve knee flexion, while taking subjective ?Slant board calf stretch 3 x 30 sec ?Modified longsitting hamstring stretch on table 3 x 30 sec ?Modified thomas stretch with passive knee flexion 3 x 30 sec ?Seated knee flexion/extension AROM multiple bouts following passive knee  stretching ?Supine knee AROM with feet on physioball 5 x 5 sec each ?Leg press (omega) staggered stance 45# 2 x 10 ?SL knee extension machine x 10 ?Manual Therapy: ?Supine patellofemoral mobs all directions ?Seated and supine tib-fem mobs to improve knee flexion and extension ?Seated and supine knee PROM for flexion and extension ? ?06/08/2021: ?Therapeutic Exercise: ?Recumbent bike partial revolutions x 5 min to improve knee flexion, while taking subjective ?Seated knee flexion/extension AROM multiple bouts following passive knee stretching - had to discontinue due to patient reporting feeling lightheaded ?Supine knee AROM with feet on physioball  5 x 5 sec each ?Supine heel prop with MHP pack placed under knee x ?Manual Therapy: ?Supine patellofemoral mobs all directions ?Seated and supine tib-fem mobs to improve knee flexion and extension ?Seated and supine knee PROM for flexion and extension ?  ?PATIENT EDUCATION:  ?Education details: HEP ?Person educated: Patient ?Education method: Explanation, Demonstration, Tactile cues, Verbal cues ?Education comprehension: verbalized understanding, returned demonstration, verbal cues required, tactile cues required, and needs further education ?  ?HOME EXERCISE PROGRAM: ?Access Code: [email or web address] ?  ?  ?ASSESSMENT: ?CLINICAL IMPRESSION: ?Patient tolerated therapy well with no adverse effects. *** Patient would benefit from continued skilled PT to progress mobility and strength in order to reduce pain and maximize functional ability. ? ? ?Therapy focus continues on knee mobility and progressing his active motion. Patient does demonstrate continued gradual improvement with knee extension, but with continued limitation in knee flexion, mainly limited by pain and stiffness at this point. Incorporated strengthening with leg press and knee extension machine this visit with good tolerance. No changes to HEP this visit. ? ?Objective impairments include Abnormal gait, decreased activity tolerance, decreased balance, difficulty walking, decreased ROM, decreased strength, and pain. ?  ?  ?GOALS: ?Goals reviewed with patient? Yes ?  ?SHORT TERM GOALS: ?  ?STG Name Target Date Goal status  ?1 Patient will be I with initial HEP in order to progress with therapy. ?Baseline: progressing with HEP 05/28/2021 ONGOING  ?2 PT will review FOTO with patient by 3rd visit in order to understand expected progress and outcome with therapy. ?Baseline: reviewed with patient 05/28/2021 ONGOING  ?3 Patient will demonstrate active knee motion 5-90 deg in order to improve gait ?Baseline: 8-75 deg 05/28/2021 ONGOING  ?4 Patient will ambulate community  level without AD to improve access and return to work without limitations ?Baseline: patient ambulating without assistive device 05/28/2021 ACHIEVED  ?  ?LONG TERM GOALS:  ?  ?LTG Name Target Date Goal status  ?1 Patient will be I with final HEP to maintain progress from PT. ?Baseline: provided at eval 06/25/2021 INITIAL  ?2 Patient will report >/= 66% status on FOTO to indicate improved functional ability. ?Baseline: 53% 06/25/2021 INITIAL  ?3 Patient will demonstrate active knee motion 2-120 deg to all normal movement mechanics with transfers and stairs ?Baseline: 15-60 deg at eval 06/25/2021 INITIAL  ?4 Patient will exhibit knee strength 5/5 MMT to improve standing tolerance reduce pain with activity ?Baseline: grossly 4-/5 in available range, extension lag with SLR 06/25/2021 INITIAL  ?5 Patient will report pain </= 2/10 with all standing and walking activity to maximize functional ability and allow full return to work ?Baseline: 7/10 06/25/2021 INITIAL  ?  ?  ?PLAN: ?PT FREQUENCY: 2x/week ?  ?PT DURATION: 8 weeks ?  ?PLANNED INTERVENTIONS: Therapeutic exercises, Therapeutic activity, Neuro Muscular re-education, Balance training, Gait training, Patient/Family education, Joint mobilization, Stair training, Aquatic Therapy, Dry Needling, Cryotherapy, Moist heat, Taping,  Vasopneumatic device, and Manual therapy ?  ?PLAN FOR NEXT SESSION: Review HEP and progress PRN, manual/stretching for knee flexion and extension, progress strength to closed chain as tolerated, vaso post session, balance and gait training. ?  ? ? ?Rosana Hoes, PT, DPT, LAT, ATC ?06/12/21  4:31 PM ?Phone: [phone] ?Fax: [phone] ? ? ? ? ?   ?

## 2021-06-14 ENCOUNTER — Ambulatory Visit: Payer: PRIVATE HEALTH INSURANCE | Attending: Physician Assistant | Admitting: Physical Therapy

## 2021-06-14 DIAGNOSIS — M25562 Pain in left knee: Secondary | ICD-10-CM | POA: Insufficient documentation

## 2021-06-14 DIAGNOSIS — R600 Localized edema: Secondary | ICD-10-CM | POA: Insufficient documentation

## 2021-06-14 DIAGNOSIS — R2689 Other abnormalities of gait and mobility: Secondary | ICD-10-CM | POA: Insufficient documentation

## 2021-06-14 DIAGNOSIS — G8929 Other chronic pain: Secondary | ICD-10-CM | POA: Insufficient documentation

## 2021-06-14 DIAGNOSIS — M6281 Muscle weakness (generalized): Secondary | ICD-10-CM | POA: Insufficient documentation

## 2021-06-15 NOTE — Therapy (Signed)
?OUTPATIENT PHYSICAL THERAPY TREATMENT NOTE ? ? ?Patient Name: Carlos Aguirre ?MRN: [id number] ?DOB:June 28, 1970, 51 y.o., male ?Today's Date: 06/18/2021 ? ?PCP: Pcp, No ?REFERRING PROVIDER: Cristie Hem, PA-C ? ? PT End of Session - 06/18/21 1358   ? ? Visit Number [id number]   ? Number of Visits 17   ? Date for PT Re-Evaluation 06/25/21   ? Authorization Type [employer]   ? Progress Note Due on Visit --   ? PT Start Time 1400   ? PT Stop Time 1445   ? PT Time Calculation (min) 45 min   ? Activity Tolerance Patient tolerated treatment well   ? Behavior During Therapy [facility] for tasks assessed/performed   ? ?  ?  ? ?  ? ? ? ? ? ? ? ? ? ? ? ? ? ? ? ?Past Medical History:  ?Diagnosis Date  ? Complication of anesthesia   ? Hard to wake up per patient  ? ?Past Surgical History:  ?Procedure Laterality Date  ? HERNIA REPAIR    ? KNEE SURGERY    ? SHOULDER SURGERY    ? TOTAL KNEE ARTHROPLASTY Left 04/02/2021  ? Procedure: LEFT TOTAL KNEE ARTHROPLASTY;  Surgeon: Tarry Kos, MD;  Location: [facility];  Service: Orthopedics;  Laterality: Left;  ? ?Patient Active Problem List  ? Diagnosis Date Noted  ? Arthrofibrosis of knee joint, left 05/16/2021  ? Status post total knee replacement, left 04/02/2021  ? Primary osteoarthritis of left knee 02/02/2021  ? ? ?REFERRING DIAG: Hx of total knee replacement, left ? ?THERAPY DIAG:  ?Chronic pain of left knee ? ?Muscle weakness (generalized) ? ?Other abnormalities of gait and mobility ? ?Localized edema ? ?PERTINENT HISTORY: None ? ?PRECAUTIONS: None ? ? ?SUBJECTIVE: Patient reports he is doing about the same, he has gotten up to 100 on the CPM but his knee continues to stiffen up especially in the morning.  ? ?PAIN:  ?Are you having pain? Yes ?NPRS scale: 4/10 ?Pain location: Knee ?Pain orientation: Left  ?PAIN TYPE: Surgical ?Pain description: Constant, stiff ?Aggravating factors: Moving the knee ?Relieving factors: Cold pack, rest, elevation ? ?PATIENT GOALS Pain relief and get back to work  without limitation ?  ?  ?OBJECTIVE: (BOLDED MEASURE ASSESSED THIS VISIT) ?PATIENT SURVEYS:  ?FOTO 53% functional status ?  ?  EDEMA ?  Right: 42 cm, Left: 45 cm ? ?LE AROM/PROM: ?  ?AROM Left ?05/31/2021 Left ?06/08/2021 Left ?06/12/2021  ?Knee flexion 84 (seated) -   ?Knee extension -8 -7 -6  ?  ?PROM Left ?04/30/2021 Left ?05/25/2021 Left ?05/28/2021 Left ?05/30/2021  ?Knee flexion 65 88 90 88  ?Knee extension      -12         -7          -6  ?  ?LE MMT: ?  ?MMT Right ?04/30/2021 Left ?04/30/2021  ?Hip flexion 4 4  ?Hip extension 4- 4-  ?Hip abduction 4- 4-  ?Knee flexion 5 4-  ?Knee extension 5 4-  ?  ?Patient able to perform SLR without extension lag this visit (05/15/2021) ? ? ?TODAY'S TREATMENT ?06/18/2021: ?Therapeutic Exercise: ?Recumbent bike partial revolutions x 5 min to improve knee flexion, while taking subjective ?Slant board calf stretch 3 x 30 sec ?Modified longsitting hamstring stretch on table 3 x 30 sec ?Modified thomas stretch with passive knee flexion 3 x 30 sec ?Prone quad stretch 3 x 30 sec ?Standing lunge knee flexion stretch on step 3 x 20  sec ?SL leg press (omega) 45# 2 x 10 ?Manual Therapy: ?Supine patellofemoral mobs all directions ?Seated and supine tib-fem mobs to improve knee flexion and extension ?Seated and supine knee PROM for flexion and extension ? ? ?06/12/2021: ?Therapeutic Exercise: ?Recumbent bike partial revolutions x 5 min to improve knee flexion, while taking subjective ?Slant board calf stretch 3 x 30 sec ?Modified longsitting hamstring stretch on table 3 x 30 sec ?Modified thomas stretch with passive knee flexion 3 x 30 sec ?Seated knee flexion/extension AROM multiple bouts following passive knee stretching ?Supine knee AROM with feet on physioball 5 x 5 sec each ?Leg press (omega) staggered stance 45# 2 x 10 ?SL knee extension machine x 10 ?Manual Therapy: ?Supine patellofemoral mobs all directions ?Seated and supine tib-fem mobs to improve knee flexion and extension ?Seated and  supine knee PROM for flexion and extension ? ?06/08/2021: ?Therapeutic Exercise: ?Recumbent bike partial revolutions x 5 min to improve knee flexion, while taking subjective ?Seated knee flexion/extension AROM multiple bouts following passive knee stretching - had to discontinue due to patient reporting feeling lightheaded ?Supine knee AROM with feet on physioball 5 x 5 sec each ?Supine heel prop with MHP pack placed under knee x ?Manual Therapy: ?Supine patellofemoral mobs all directions ?Seated and supine tib-fem mobs to improve knee flexion and extension ?Seated and supine knee PROM for flexion and extension ?  ?PATIENT EDUCATION:  ?Education details: HEP ?Person educated: Patient ?Education method: Explanation, Demonstration, Tactile cues, Verbal cues ?Education comprehension: verbalized understanding, returned demonstration, verbal cues required, tactile cues required, and needs further education ?  ?HOME EXERCISE PROGRAM: ?Access Code: [email or web address] ?  ?  ?ASSESSMENT: ?CLINICAL IMPRESSION: ?Patient tolerated therapy well with no adverse effects. Therapy continues to focus primarily on improving knee flexion and extension. He did have more difficulty with knee flexion this visit and reporting increased pain at end flexion. The majority of his pain is located on the medial aspect of the knee. Continued with leg press for strengthening with good tolerance. No changes to HEP. Patient would benefit from continued skilled PT to progress mobility and strength in order to reduce pain and maximize functional ability. ? ?Objective impairments include Abnormal gait, decreased activity tolerance, decreased balance, difficulty walking, decreased ROM, decreased strength, and pain. ?  ?  ?GOALS: ?Goals reviewed with patient? Yes ?  ?SHORT TERM GOALS: ?  ?STG Name Target Date Goal status  ?1 Patient will be I with initial HEP in order to progress with therapy. ?Baseline: progressing with HEP 05/28/2021 ONGOING  ?2 PT will  review FOTO with patient by 3rd visit in order to understand expected progress and outcome with therapy. ?Baseline: reviewed with patient 05/28/2021 ONGOING  ?3 Patient will demonstrate active knee motion 5-90 deg in order to improve gait ?Baseline: 8-75 deg 05/28/2021 ONGOING  ?4 Patient will ambulate community level without AD to improve access and return to work without limitations ?Baseline: patient ambulating without assistive device 05/28/2021 ACHIEVED  ?  ?LONG TERM GOALS:  ?  ?LTG Name Target Date Goal status  ?1 Patient will be I with final HEP to maintain progress from PT. ?Baseline: provided at eval 06/25/2021 INITIAL  ?2 Patient will report >/= 66% status on FOTO to indicate improved functional ability. ?Baseline: 53% 06/25/2021 INITIAL  ?3 Patient will demonstrate active knee motion 2-120 deg to all normal movement mechanics with transfers and stairs ?Baseline: 15-60 deg at eval 06/25/2021 INITIAL  ?4 Patient will exhibit knee strength 5/5 MMT to improve standing  tolerance reduce pain with activity ?Baseline: grossly 4-/5 in available range, extension lag with SLR 06/25/2021 INITIAL  ?5 Patient will report pain </= 2/10 with all standing and walking activity to maximize functional ability and allow full return to work ?Baseline: 7/10 06/25/2021 INITIAL  ?  ?  ?PLAN: ?PT FREQUENCY: 2x/week ?  ?PT DURATION: 8 weeks ?  ?PLANNED INTERVENTIONS: Therapeutic exercises, Therapeutic activity, Neuro Muscular re-education, Balance training, Gait training, Patient/Family education, Joint mobilization, Stair training, Aquatic Therapy, Dry Needling, Cryotherapy, Moist heat, Taping, Vasopneumatic device, and Manual therapy ?  ?PLAN FOR NEXT SESSION: Review HEP and progress PRN, manual/stretching for knee flexion and extension, progress strength to closed chain as tolerated, vaso post session, balance and gait training. ?  ? ? ?Rosana Hoes, PT, DPT, LAT, ATC ?06/18/21  2:47 PM ?Phone: [phone] ?Fax:  [phone] ? ? ? ? ?   ?

## 2021-06-18 ENCOUNTER — Ambulatory Visit: Payer: PRIVATE HEALTH INSURANCE | Admitting: Physical Therapy

## 2021-06-18 ENCOUNTER — Encounter: Payer: Self-pay | Admitting: Physical Therapy

## 2021-06-18 ENCOUNTER — Other Ambulatory Visit: Payer: Self-pay

## 2021-06-18 DIAGNOSIS — M25562 Pain in left knee: Secondary | ICD-10-CM | POA: Diagnosis not present

## 2021-06-18 DIAGNOSIS — R2689 Other abnormalities of gait and mobility: Secondary | ICD-10-CM | POA: Diagnosis present

## 2021-06-18 DIAGNOSIS — R600 Localized edema: Secondary | ICD-10-CM | POA: Diagnosis present

## 2021-06-18 DIAGNOSIS — M6281 Muscle weakness (generalized): Secondary | ICD-10-CM | POA: Diagnosis present

## 2021-06-18 DIAGNOSIS — G8929 Other chronic pain: Secondary | ICD-10-CM | POA: Diagnosis present

## 2021-06-19 NOTE — Therapy (Signed)
OUTPATIENT PHYSICAL THERAPY TREATMENT NOTE   Patient Name: Carlos Aguirre MRN: [id number] DOB:[birth year], 51 y.o., male Today's Date: 06/21/2021  PCP: Merryl Hacker, No REFERRING PROVIDER: Aundra Dubin, PA-C   PT End of Session - 06/21/21 1004     Visit Number 17    Number of Visits 27    Date for PT Re-Evaluation 08/16/21    Authorization Type [employer]    PT Start Time 1000    PT Stop Time 1045    PT Time Calculation (min) 45 min    Activity Tolerance Patient tolerated treatment well    Behavior During Therapy WFL for tasks assessed/performed                           Past Medical History:  Diagnosis Date   Complication of anesthesia    Hard to wake up per patient   Past Surgical History:  Procedure Laterality Date   HERNIA REPAIR     KNEE SURGERY     SHOULDER SURGERY     TOTAL KNEE ARTHROPLASTY Left 04/02/2021   Procedure: LEFT TOTAL KNEE ARTHROPLASTY;  Surgeon: Leandrew Koyanagi, MD;  Location: [place];  Service: Orthopedics;  Laterality: Left;   Patient Active Problem List   Diagnosis Date Noted   Arthrofibrosis of knee joint, left 05/16/2021   Status post total knee replacement, left 04/02/2021   Primary osteoarthritis of left knee 02/02/2021    REFERRING DIAG: Hx of total knee replacement, left  THERAPY DIAG:  Chronic pain of left knee  Muscle weakness (generalized)  Other abnormalities of gait and mobility  Localized edema  PERTINENT HISTORY: None  PRECAUTIONS: None   SUBJECTIVE: Patient reports he is doing well. He continues to use the CPM and feels he is progressing well on that.  PAIN:  Are you having pain? Yes NPRS scale: 4/10 Pain location: Knee Pain orientation: Left  PAIN TYPE: Surgical Pain description: Constant, stiff Aggravating factors: Moving the knee Relieving factors: Cold pack, rest, elevation  PATIENT GOALS Pain relief and get back to work without limitation     OBJECTIVE: (BOLDED MEASURE ASSESSED THIS  VISIT) PATIENT SURVEYS:  FOTO 56% functional status (53% at evaluation)     EDEMA   Right: 42 cm, Left: 45 cm  LE AROM/PROM:   AROM Left 05/31/2021 Left 06/08/2021 Left 06/12/2021 Left 06/21/2021  Knee flexion 84 (seated) -  85  Knee extension -8 -7 -6 -6    PROM Left 04/30/2021 Left 05/25/2021 Left 05/28/2021 Left 05/30/2021  Knee flexion 65 88 90 88  Knee extension      -12         -7          -6    LE MMT:   MMT Right 04/30/2021 Left 04/30/2021 Left 06/21/2021  Hip flexion 4 4   Hip extension 4- 4-   Hip abduction 4- 4-   Knee flexion 5 4- 4  Knee extension 5 4- 4    Patient able to perform SLR without extension lag this visit (05/15/2021)   TODAY'S TREATMENT 06/21/2021: Therapeutic Exercise: Recumbent bike partial revolutions x 5 min to improve knee flexion, while taking subjective Modified thomas stretch with passive knee flexion 3 x 30 sec Prone quad stretch 3 x 30 sec Seated knee flexion/extension AROM throughout seated manual Supine knee flexion/extension AROM with feet on physioball Standing lunge knee flexion stretch on step 3 x 20 sec SL leg press (omega) 45#  2 x 10 Manual Therapy: Seated and supine tib-fem mobs to improve knee flexion and extension Seated and supine knee PROM for flexion and extension Supine patellofemoral mobs all directions   06/18/2021: Therapeutic Exercise: Recumbent bike partial revolutions x 5 min to improve knee flexion, while taking subjective Slant board calf stretch 3 x 30 sec Modified longsitting hamstring stretch on table 3 x 30 sec Modified thomas stretch with passive knee flexion 3 x 30 sec Prone quad stretch 3 x 30 sec Standing lunge knee flexion stretch on step 3 x 20 sec SL leg press (omega) 45# 2 x 10 Manual Therapy: Supine patellofemoral mobs all directions Seated and supine tib-fem mobs to improve knee flexion and extension Seated and supine knee PROM for flexion and extension  06/12/2021: Therapeutic  Exercise: Recumbent bike partial revolutions x 5 min to improve knee flexion, while taking subjective Slant board calf stretch 3 x 30 sec Modified longsitting hamstring stretch on table 3 x 30 sec Modified thomas stretch with passive knee flexion 3 x 30 sec Seated knee flexion/extension AROM multiple bouts following passive knee stretching Supine knee AROM with feet on physioball 5 x 5 sec each Leg press (omega) staggered stance 45# 2 x 10 SL knee extension machine x 10 Manual Therapy: Supine patellofemoral mobs all directions Seated and supine tib-fem mobs to improve knee flexion and extension Seated and supine knee PROM for flexion and extension   PATIENT EDUCATION:  Education details: POC update, FOTO, progress toward goals, HEP Person educated: Patient Education method: Explanation, Demonstration, Tactile cues, Verbal cues Education comprehension: verbalized understanding, returned demonstration, verbal cues required, tactile cues required, and needs further education   HOME EXERCISE PROGRAM: Access Code: [email or web address]     ASSESSMENT: CLINICAL IMPRESSION: Patient tolerated therapy well with no adverse effects. Patient continues to demonstrate gradual improvement in his knee motion and has been progressing well with his strengthening exercises. He does report improved functional ability on FOTO this visit. Overall he continues to exhibit limitations in his left knee motion that effect his gait and general mobility. Patient is progressing toward LTGs so will extended POC for another 8 weeks at frequency of 1-2x/week. Patient would benefit from continued skilled PT to progress mobility and strength in order to reduce pain and maximize functional ability.  Objective impairments include Abnormal gait, decreased activity tolerance, decreased balance, difficulty walking, decreased ROM, decreased strength, and pain.     GOALS: Goals reviewed with patient? Yes   SHORT TERM GOALS:   STG Name  Target Date Goal status  1 Patient will be I with initial HEP in order to progress with therapy. Baseline: independent with initial HEP 05/28/2021 MET  2 PT will review FOTO with patient by 3rd visit in order to understand expected progress and outcome with therapy. Baseline: reviewed with patient 05/28/2021 MET  3 Patient will demonstrate active knee motion 5-90 deg in order to improve gait Baseline: 8-75 deg 06/21/2021: 6-85 deg 07/18/2021 ONGOING  4 Patient will ambulate community level without AD to improve access and return to work without limitations Baseline: patient ambulating without assistive device 05/28/2021 MET    LONG TERM GOALS:    LTG Name Target Date Goal status  1 Patient will be I with final HEP to maintain progress from PT. Baseline: provided at eval 06/21/2021: progressing with HEP 08/16/2021 ONGOING  2 Patient will report >/= 66% status on FOTO to indicate improved functional ability. Baseline: 53% 06/21/2021: 56% 08/16/2021 ONGOING  3 Patient will demonstrate active knee  motion 2-100 deg to all normal movement mechanics with transfers and stairs Baseline: 15-60 deg at eval 06/21/2021: 6-85 deg 08/16/2021 MODIFIED  4 Patient will exhibit knee strength 5/5 MMT to improve standing tolerance reduce pain with activity Baseline: grossly 4-/5 in available range, extension lag with SLR 06/21/2021: grossly 4/5 MMT 08/16/2021 ONGOING  5 Patient will report pain </= 2/10 with all standing and walking activity to maximize functional ability and allow full return to work Baseline: 7/10 06/21/2021: 4/10  08/16/2021 ONGOING      PLAN: PT FREQUENCY: 1-2x/week   PT DURATION: 8 weeks   PLANNED INTERVENTIONS: Therapeutic exercises, Therapeutic activity, Neuro Muscular re-education, Balance training, Gait training, Patient/Family education, Joint mobilization, Stair training, Aquatic Therapy, Dry Needling, Cryotherapy, Moist heat, Taping, Vasopneumatic device, and Manual therapy   PLAN FOR NEXT  SESSION: Review HEP and progress PRN, manual/stretching for knee flexion and extension, progress strength to closed chain as tolerated, vaso post session, balance and gait training.     Hilda Blades, PT, DPT, LAT, ATC 06/21/21  10:50 AM Phone: [phone] Fax: [phone]

## 2021-06-21 ENCOUNTER — Ambulatory Visit: Payer: PRIVATE HEALTH INSURANCE | Admitting: Physical Therapy

## 2021-06-21 ENCOUNTER — Other Ambulatory Visit: Payer: Self-pay

## 2021-06-21 ENCOUNTER — Encounter: Payer: Self-pay | Admitting: Physical Therapy

## 2021-06-21 DIAGNOSIS — R2689 Other abnormalities of gait and mobility: Secondary | ICD-10-CM

## 2021-06-21 DIAGNOSIS — R600 Localized edema: Secondary | ICD-10-CM

## 2021-06-21 DIAGNOSIS — M25562 Pain in left knee: Secondary | ICD-10-CM | POA: Diagnosis not present

## 2021-06-21 DIAGNOSIS — G8929 Other chronic pain: Secondary | ICD-10-CM

## 2021-06-21 DIAGNOSIS — M6281 Muscle weakness (generalized): Secondary | ICD-10-CM

## 2021-06-25 NOTE — Therapy (Incomplete)
OUTPATIENT PHYSICAL THERAPY TREATMENT NOTE   Patient Name: Carlos Aguirre MRN: [id number] DOB:28-Mar-1971, 51 y.o., male Today's Date: 06/25/2021  PCP: Merryl Hacker, No REFERRING PROVIDER: Nathaniel Man                   Past Medical History:  Diagnosis Date   Complication of anesthesia    Hard to wake up per patient   Past Surgical History:  Procedure Laterality Date   [place] Left 04/02/2021   Procedure: LEFT TOTAL KNEE ARTHROPLASTY;  Surgeon: Leandrew Koyanagi, MD;  Location: [place];  Service: Orthopedics;  Laterality: Left;   Patient Active Problem List   Diagnosis Date Noted   Arthrofibrosis of knee joint, left 05/16/2021   Status post total knee replacement, left 04/02/2021   Primary osteoarthritis of left knee 02/02/2021    REFERRING DIAG: Hx of total knee replacement, left  THERAPY DIAG:  No diagnosis found.  PERTINENT HISTORY: None  PRECAUTIONS: None   SUBJECTIVE: Patient reports he is doing well. He continues to use the CPM and feels he is progressing well on that.  PAIN:  Are you having pain? Yes NPRS scale: 4/10 Pain location: Knee Pain orientation: Left  PAIN TYPE: Surgical Pain description: Constant, stiff Aggravating factors: Moving the knee Relieving factors: Cold pack, rest, elevation  PATIENT GOALS Pain relief and get back to work without limitation     OBJECTIVE: (BOLDED MEASURE ASSESSED THIS VISIT) PATIENT SURVEYS:  FOTO 56% functional status - 06/21/2021 (53% at evaluation)     EDEMA   Right: 42 cm, Left: 45 cm  LE AROM/PROM:   AROM Left 05/31/2021 Left 06/08/2021 Left 06/12/2021 Left 06/21/2021  Knee flexion 84 (seated) -  85  Knee extension -8 -7 -6 -6    PROM Left 04/30/2021 Left 05/25/2021 Left 05/28/2021 Left 05/30/2021  Knee flexion 65 88 90 88  Knee extension      -12         -7          -6    LE MMT:   MMT Right 04/30/2021  Left 04/30/2021 Left 06/21/2021  Hip flexion 4 4   Hip extension 4- 4-   Hip abduction 4- 4-   Knee flexion 5 4- 4  Knee extension 5 4- 4    Patient able to perform SLR without extension lag this visit (05/15/2021)   TODAY'S TREATMENT 06/26/2021: Therapeutic Exercise: Recumbent bike partial revolutions x 5 min to improve knee flexion, while taking subjective Modified thomas stretch with passive knee flexion 3 x 30 sec Prone quad stretch 3 x 30 sec Seated knee flexion/extension AROM throughout seated manual Supine knee flexion/extension AROM with feet on physioball Standing lunge knee flexion stretch on step 3 x 20 sec SL leg press (omega) 45# 2 x 10 Manual Therapy: Seated and supine tib-fem mobs to improve knee flexion and extension Seated and supine knee PROM for flexion and extension Supine patellofemoral mobs all directions   06/21/2021: Therapeutic Exercise: Recumbent bike partial revolutions x 5 min to improve knee flexion, while taking subjective Modified thomas stretch with passive knee flexion 3 x 30 sec Prone quad stretch 3 x 30 sec Seated knee flexion/extension AROM throughout seated manual Supine knee flexion/extension AROM with feet on physioball Standing lunge knee flexion stretch on step 3 x 20 sec SL leg press (omega) 45# 2 x 10 Manual  Therapy: Seated and supine tib-fem mobs to improve knee flexion and extension Seated and supine knee PROM for flexion and extension Supine patellofemoral mobs all directions  06/18/2021: Therapeutic Exercise: Recumbent bike partial revolutions x 5 min to improve knee flexion, while taking subjective Slant board calf stretch 3 x 30 sec Modified longsitting hamstring stretch on table 3 x 30 sec Modified thomas stretch with passive knee flexion 3 x 30 sec Prone quad stretch 3 x 30 sec Standing lunge knee flexion stretch on step 3 x 20 sec SL leg press (omega) 45# 2 x 10 Manual Therapy: Supine patellofemoral mobs all  directions Seated and supine tib-fem mobs to improve knee flexion and extension Seated and supine knee PROM for flexion and extension   PATIENT EDUCATION:  Education details: POC update, FOTO, progress toward goals, HEP Person educated: Patient Education method: Explanation, Demonstration, Tactile cues, Verbal cues Education comprehension: verbalized understanding, returned demonstration, verbal cues required, tactile cues required, and needs further education   HOME EXERCISE PROGRAM: Access Code: [email or web address]     ASSESSMENT: CLINICAL IMPRESSION: Patient tolerated therapy well with no adverse effects. *** Patient would benefit from continued skilled PT to progress mobility and strength in order to reduce pain and maximize functional ability.  Patient continues to demonstrate gradual improvement in his knee motion and has been progressing well with his strengthening exercises. He does report improved functional ability on FOTO this visit. Overall he continues to exhibit limitations in his left knee motion that effect his gait and general mobility. Patient is progressing toward LTGs so will extended POC for another 8 weeks at frequency of 1-2x/week. Patient would benefit from continued skilled PT to progress mobility and strength in order to reduce pain and maximize functional ability.  Objective impairments include Abnormal gait, decreased activity tolerance, decreased balance, difficulty walking, decreased ROM, decreased strength, and pain.     GOALS: Goals reviewed with patient? Yes   SHORT TERM GOALS:   STG Name Target Date Goal status  1 Patient will be I with initial HEP in order to progress with therapy. Baseline: independent with initial HEP 05/28/2021 MET  2 PT will review FOTO with patient by 3rd visit in order to understand expected progress and outcome with therapy. Baseline: reviewed with patient 05/28/2021 MET  3 Patient will demonstrate active knee motion 5-90 deg in order to  improve gait Baseline: 8-75 deg 06/21/2021: 6-85 deg 07/18/2021 ONGOING  4 Patient will ambulate community level without AD to improve access and return to work without limitations Baseline: patient ambulating without assistive device 05/28/2021 MET    LONG TERM GOALS:    LTG Name Target Date Goal status  1 Patient will be I with final HEP to maintain progress from PT. Baseline: provided at eval 06/21/2021: progressing with HEP 08/16/2021 ONGOING  2 Patient will report >/= 66% status on FOTO to indicate improved functional ability. Baseline: 53% 06/21/2021: 56% 08/16/2021 ONGOING  3 Patient will demonstrate active knee motion 2-100 deg to all normal movement mechanics with transfers and stairs Baseline: 15-60 deg at eval 06/21/2021: 6-85 deg 08/16/2021 MODIFIED  4 Patient will exhibit knee strength 5/5 MMT to improve standing tolerance reduce pain with activity Baseline: grossly 4-/5 in available range, extension lag with SLR 06/21/2021: grossly 4/5 MMT 08/16/2021 ONGOING  5 Patient will report pain </= 2/10 with all standing and walking activity to maximize functional ability and allow full return to work Baseline: 7/10 06/21/2021: 4/10  08/16/2021 ONGOING      PLAN: PT  FREQUENCY: 1-2x/week   PT DURATION: 8 weeks   PLANNED INTERVENTIONS: Therapeutic exercises, Therapeutic activity, Neuro Muscular re-education, Balance training, Gait training, Patient/Family education, Joint mobilization, Stair training, Aquatic Therapy, Dry Needling, Cryotherapy, Moist heat, Taping, Vasopneumatic device, and Manual therapy   PLAN FOR NEXT SESSION: Review HEP and progress PRN, manual/stretching for knee flexion and extension, progress strength to closed chain as tolerated, vaso post session, balance and gait training.     Hilda Blades, PT, DPT, LAT, ATC 06/25/21  2:31 PM Phone: [phone] Fax: [phone]

## 2021-06-26 ENCOUNTER — Other Ambulatory Visit: Payer: Self-pay

## 2021-06-26 ENCOUNTER — Ambulatory Visit: Payer: PRIVATE HEALTH INSURANCE | Admitting: Physical Therapy

## 2021-06-26 ENCOUNTER — Encounter: Payer: Self-pay | Admitting: Physical Therapy

## 2021-06-26 DIAGNOSIS — G8929 Other chronic pain: Secondary | ICD-10-CM

## 2021-06-26 DIAGNOSIS — M6281 Muscle weakness (generalized): Secondary | ICD-10-CM

## 2021-06-26 DIAGNOSIS — M25562 Pain in left knee: Secondary | ICD-10-CM | POA: Diagnosis not present

## 2021-06-26 DIAGNOSIS — R600 Localized edema: Secondary | ICD-10-CM

## 2021-06-26 DIAGNOSIS — R2689 Other abnormalities of gait and mobility: Secondary | ICD-10-CM

## 2021-06-26 NOTE — Patient Instructions (Signed)
Access Code: [email or web address] ?URL: [email or web address] ?Date: 06/26/2021 ?Prepared by: Rosana Hoes ? ?Exercises ?Supine Heel Slide with Strap - 2-3 x daily - 7 x weekly - 10 reps - 10 hold ?Supine Knee Extension Mobilization with Weight - 1-2 x daily - 7 x weekly - 5 min hold ?Supine Quad Set - 2-3 x daily - 7 x weekly - 10 reps - 5 seconds hold ?Active Straight Leg Raise with Quad Set - 2-3 x daily - 7 x weekly - 2 sets - 5 reps ?Seated Knee Flexion AAROM - 1 x daily - 7 x weekly - 10 reps - 10 seconds hold ?Seated Long Arc Quad - 2-3 x daily - 7 x weekly - 2 sets - 5 reps - 5 seconds hold ?Seated Hamstring Stretch - 2-3 x daily - 7 x weekly - 3 reps - 30 seconds hold ?Seated Heel Slide - 2-3 x daily - 7 x weekly - 10 reps ?Standing Gastroc Stretch at [employer] - 2-3 x daily - 7 x weekly - 3 reps - 30 seconds hold ?Seated Table Hamstring Stretch - 1 x daily - 7 x weekly - 3 sets - 60 reps ?Standing Knee Flexion Stretch on Step - 1-2 x daily - 7 x weekly - 3 sets - 10 reps - 10 seconds hold ?Prone Quadriceps Stretch with Strap - 1-2 x daily - 7 x weekly - 3 reps - 30-60 hold ?Supine Quadriceps Stretch with Strap on Table - 1-2 x daily - 7 x weekly - 3 reps - 30-60 hold ? ?

## 2021-06-27 NOTE — Therapy (Signed)
?OUTPATIENT PHYSICAL THERAPY TREATMENT NOTE ? ? ?Patient Name: Carlos Aguirre ?MRN: [id number] ?DOB:25-Jul-1970, 51 y.o., male ?Today's Date: 06/28/2021 ? ?PCP: Pcp, No ?REFERRING PROVIDER: Aundra Dubin, PA-C ? ? PT End of Session - 06/28/21 1537   ? ? Visit Number [id number]   ? Number of Visits 27   ? Date for PT Re-Evaluation 08/16/21   ? Authorization Type [employer]   ? PT Start Time 1530   ? PT Stop Time 1615   ? PT Time Calculation (min) 45 min   ? Activity Tolerance Patient tolerated treatment well   ? Behavior During Therapy [facility] for tasks assessed/performed   ? ?  ?  ? ?  ? ? ? ? ? ? ? ? ? ? ? ? ? ? ? ? ? ? ?Past Medical History:  ?Diagnosis Date  ? Complication of anesthesia   ? Hard to wake up per patient  ? ?Past Surgical History:  ?Procedure Laterality Date  ? HERNIA REPAIR    ? KNEE SURGERY    ? SHOULDER SURGERY    ? TOTAL KNEE ARTHROPLASTY Left 04/02/2021  ? Procedure: LEFT TOTAL KNEE ARTHROPLASTY;  Surgeon: Leandrew Koyanagi, MD;  Location: [facility];  Service: Orthopedics;  Laterality: Left;  ? ?Patient Active Problem List  ? Diagnosis Date Noted  ? Arthrofibrosis of knee joint, left 05/16/2021  ? Status post total knee replacement, left 04/02/2021  ? Primary osteoarthritis of left knee 02/02/2021  ? ? ?REFERRING DIAG: Hx of total knee replacement, left ? ?THERAPY DIAG:  ?Chronic pain of left knee ? ?Muscle weakness (generalized) ? ?Other abnormalities of gait and mobility ? ?Localized edema ? ?PERTINENT HISTORY: None ? ?PRECAUTIONS: None ? ? ?SUBJECTIVE: Patient reports they took his CPM so he hasn't been able to do that the past few days. He has still been trying to stretch as much as he can. ? ?PAIN:  ?Are you having pain? Yes ?NPRS scale: 5/10 ?Pain location: Knee ?Pain orientation: Left  ?PAIN TYPE: Surgical ?Pain description: Constant, stiff ?Aggravating factors: Moving the knee ?Relieving factors: Cold pack, rest, elevation ? ?PATIENT GOALS Pain relief and get back to work without limitation ?  ?   ?OBJECTIVE: (BOLDED MEASURE ASSESSED THIS VISIT) ?PATIENT SURVEYS:  ?FOTO 56% functional status - 06/21/2021 (53% at evaluation) ?  ?  EDEMA ?  Right: 42 cm, Left: 42.5 cm ? ?Patient demonstrates slight increase edema lateral aspect of knee ? ?LE AROM/PROM: ?  ?AROM Left ?05/31/2021 Left ?06/08/2021 Left ?06/12/2021 Left ?06/21/2021  ?Knee flexion 84 (seated) -  85  ?Knee extension -8 -7 -6 -6  ?  ?PROM Left ?04/30/2021 Left ?05/25/2021 Left ?05/28/2021 Left ?05/30/2021  ?Knee flexion 65 88 90 88  ?Knee extension      -12         -7          -6  ?  ?LE MMT: ?  ?MMT Right ?04/30/2021 Left ?04/30/2021 Left ?06/21/2021 Left ?06/28/2021  ?Hip flexion 4 4    ?Hip extension 4- 4-    ?Hip abduction 4- 4-    ?Knee flexion 5 4- 4 4  ?Knee extension 5 4- 4 4  ?  ?Patient able to perform SLR without extension lag this visit (05/15/2021) ? ? ?TODAY'S TREATMENT ?06/28/2021: ?Therapeutic Exercise: ?Recumbent bike partial revolutions x 5 min to improve knee flexion, while taking subjective ?Seated knee flexion/extension AROM throughout seated manual ?Prone hamstring curl with 10# x 10 - lower hanging off  edge of the table for pone knee extension stretch, MHP applied to hamstring ?Longsitting hamstring strength 3 x 30 sec ?SL leg press (omega) 55# 2 x 10 ?Forward step-up 8" box x 10 ?Forward heel tap 4" box x 10 ?Manual Therapy: ?Seated and supine tib-fem mobs to improve knee flexion and extension - MHP applied to quad while seated ?Seated and supine knee PROM for flexion and extension - MHP applied to quad while seated ?Supine patellofemoral mobs all directions - MHP applied to posterior knee ? ? ?06/26/2021: ?Therapeutic Exercise: ?Recumbent bike partial revolutions x 5 min to improve knee flexion, while taking subjective ?Seated knee flexion/extension AROM throughout seated manual ?Modified thomas stretch with passive knee flexion 3 x 30 sec - MHP applied to quad ?Longsitting hamstring strength 3 x 30 sec - MHP applied to quad ?Slant board calf  stretch 3 x 30 sec ?Standing lunge knee flexion stretch on step 3 x 20 sec ?SL leg press (omega) 55# 2 x 10 ?Manual Therapy: ?Seated and supine tib-fem mobs to improve knee flexion and extension - MHP applied to quad while seated ?Seated and supine knee PROM for flexion and extension - MHP applied to quad while seated ?Supine patellofemoral mobs all directions - MHP applied to posterior knee ?STM to quad while in modified thomas stretch using roller ? ?06/21/2021: ?Therapeutic Exercise: ?Recumbent bike partial revolutions x 5 min to improve knee flexion, while taking subjective ?Modified thomas stretch with passive knee flexion 3 x 30 sec ?Prone quad stretch 3 x 30 sec ?Seated knee flexion/extension AROM throughout seated manual ?Supine knee flexion/extension AROM with feet on physioball ?Standing lunge knee flexion stretch on step 3 x 20 sec ?SL leg press (omega) 45# 2 x 10 ?Manual Therapy: ?Seated and supine tib-fem mobs to improve knee flexion and extension ?Seated and supine knee PROM for flexion and extension ?Supine patellofemoral mobs all directions ?  ?PATIENT EDUCATION:  ?Education details: HEP, using stationary bike at home for knee flexion stretch ?Person educated: Patient ?Education method: Explanation, Demonstration, Tactile cues, Verbal cues ?Education comprehension: verbalized understanding, returned demonstration, verbal cues required, tactile cues required, and needs further education ?  ?HOME EXERCISE PROGRAM: ?Access Code: [email or web address] ?  ?  ?ASSESSMENT: ?CLINICAL IMPRESSION: ?Patient tolerated therapy well with no adverse effects. He saw the surgeon today who told him it would be a slow process and to continue working on knee flexion/extension. Continued using MHP during stretching and table based exercises to reduce pain with exercise and improve motion. Continued with strengthening and loading the knee through full ranges of motion. No changes to HEP but encouraged patient continued stretching.  Patient would benefit from continued skilled PT to progress mobility and strength in order to reduce pain and maximize functional ability. ? ?Objective impairments include Abnormal gait, decreased activity tolerance, decreased balance, difficulty walking, decreased ROM, decreased strength, and pain. ?  ?  ?GOALS: ?Goals reviewed with patient? Yes ?  ?SHORT TERM GOALS: ?  ?STG Name Target Date Goal status  ?1 Patient will be I with initial HEP in order to progress with therapy. ?Baseline: independent with initial HEP 05/28/2021 MET  ?2 PT will review FOTO with patient by 3rd visit in order to understand expected progress and outcome with therapy. ?Baseline: reviewed with patient 05/28/2021 MET  ?3 Patient will demonstrate active knee motion 5-90 deg in order to improve gait ?Baseline: 8-75 deg ?06/21/2021: 6-85 deg 07/18/2021 ONGOING  ?4 Patient will ambulate community level without AD to improve access and return  to work without limitations ?Baseline: patient ambulating without assistive device 05/28/2021 MET  ?  ?LONG TERM GOALS:  ?  ?LTG Name Target Date Goal status  ?1 Patient will be I with final HEP to maintain progress from PT. ?Baseline: provided at eval ?06/21/2021: progressing with HEP 08/16/2021 ONGOING  ?2 Patient will report >/= 66% status on FOTO to indicate improved functional ability. ?Baseline: 53% ?06/21/2021: 56% 08/16/2021 ONGOING  ?3 Patient will demonstrate active knee motion 2-100 deg to all normal movement mechanics with transfers and stairs ?Baseline: 15-60 deg at eval ?06/21/2021: 6-85 deg 08/16/2021 MODIFIED  ?4 Patient will exhibit knee strength 5/5 MMT to improve standing tolerance reduce pain with activity ?Baseline: grossly 4-/5 in available range, extension lag with SLR ?06/21/2021: grossly 4/5 MMT 08/16/2021 ONGOING  ?5 Patient will report pain </= 2/10 with all standing and walking activity to maximize functional ability and allow full return to work ?Baseline: 7/10 ?06/21/2021: 4/10  08/16/2021 ONGOING  ?  ?   ?PLAN: ?PT FREQUENCY: 1-2x/week ?  ?PT DURATION: 8 weeks ?  ?PLANNED INTERVENTIONS: Therapeutic exercises, Therapeutic activity, Neuro Muscular re-education, Balance training, Gait training, Patient/Family educ

## 2021-06-28 ENCOUNTER — Ambulatory Visit: Payer: PRIVATE HEALTH INSURANCE | Admitting: Physical Therapy

## 2021-06-28 ENCOUNTER — Encounter: Payer: Self-pay | Admitting: Orthopaedic Surgery

## 2021-06-28 ENCOUNTER — Ambulatory Visit (INDEPENDENT_AMBULATORY_CARE_PROVIDER_SITE_OTHER): Payer: PRIVATE HEALTH INSURANCE

## 2021-06-28 ENCOUNTER — Other Ambulatory Visit: Payer: Self-pay

## 2021-06-28 ENCOUNTER — Encounter: Payer: Self-pay | Admitting: Physical Therapy

## 2021-06-28 ENCOUNTER — Ambulatory Visit (INDEPENDENT_AMBULATORY_CARE_PROVIDER_SITE_OTHER): Payer: PRIVATE HEALTH INSURANCE | Admitting: Orthopaedic Surgery

## 2021-06-28 DIAGNOSIS — M25562 Pain in left knee: Secondary | ICD-10-CM | POA: Diagnosis not present

## 2021-06-28 DIAGNOSIS — M24662 Ankylosis, left knee: Secondary | ICD-10-CM

## 2021-06-28 DIAGNOSIS — Z96652 Presence of left artificial knee joint: Secondary | ICD-10-CM

## 2021-06-28 NOTE — Progress Notes (Signed)
? ?  Office Visit Note ?  ?Patient: Carlos Aguirre           ?Date of Birth: [birth year]           ?MRN: [id number] ?Visit Date: 06/28/2021 ?             ?Requested by: No referring provider defined for this encounter. ?PCP: Pcp, No ? ? ?Assessment & Plan: ?Visit Diagnoses:  ?1. Arthrofibrosis of knee joint, left   ?2. Status post total knee replacement, left   ? ? ?Plan: Jhace is status post manipulation of his left knee.  He is overall about 3 months status post left total knee replacement.  He is gotten to about 88 to 90 degrees of flexion at physical therapy.  His extension is much better and he is not functionally limited by the extension.  He is doing physical therapy twice a week.  The CPM machine was picked up last week.  Continues to use [employer].  He is tender to the medial aspect of the knee. ? ?Examination left knee shows a fully healed surgical scar.  Range of motion is approximately 5 to 85 degrees.  No signs of infection.  He is slightly tender diffusely to the medial side of the knee. ? ?Overall Savio is making slow progress with his range of motion.  He has returned back to his job.  He will get a recumbent bike to continue to work on range of motion.  Recheck in 6 weeks. ? ?Follow-Up Instructions: Return in about 6 weeks (around 08/09/2021).  ? ?Orders:  ?Orders Placed This Encounter  ?Procedures  ? XR Knee 1-2 Views Left  ? ?No orders of the defined types were placed in this encounter. ? ? ? ? Procedures: ?No procedures performed ? ? ?Clinical Data: ?No additional findings. ? ? ?Subjective: ?Chief Complaint  ?Patient presents with  ? Left Knee - Pain  ? ? ?HPI ? ?Review of Systems ? ? ?Objective: ?Vital Signs: There were no vitals taken for this visit. ? ?Physical Exam ? ?Ortho Exam ? ?Specialty Comments:  ?No specialty comments available. ? ?Imaging: ?XR Knee 1-2 Views Left ? ?Result Date: 06/28/2021 ?Stable total knee replacement in good alignment.   ? ? ?PMFS History: ?Patient Active Problem List  ?  Diagnosis Date Noted  ? Arthrofibrosis of knee joint, left 05/16/2021  ? Status post total knee replacement, left 04/02/2021  ? Primary osteoarthritis of left knee 02/02/2021  ? ?Past Medical History:  ?Diagnosis Date  ? Complication of anesthesia   ? Hard to wake up per patient  ?  ?History reviewed. No pertinent family history.  ?Past Surgical History:  ?Procedure Laterality Date  ? HERNIA REPAIR    ? KNEE SURGERY    ? SHOULDER SURGERY    ? TOTAL KNEE ARTHROPLASTY Left 04/02/2021  ? Procedure: LEFT TOTAL KNEE ARTHROPLASTY;  Surgeon: Tarry Kos, MD;  Location: [facility];  Service: Orthopedics;  Laterality: Left;  ? ?Social History  ? ?Occupational History  ? Not on file  ?Tobacco Use  ? Smoking status: Never  ? Smokeless tobacco: Not on file  ?Vaping Use  ? Vaping Use: Never used  ?Substance and Sexual Activity  ? Alcohol use: No  ? Drug use: No  ? Sexual activity: Not on file  ? ? ? ? ? ? ?

## 2021-07-02 NOTE — Therapy (Signed)
?OUTPATIENT PHYSICAL THERAPY TREATMENT NOTE ? ? ?Patient Name: Carlos Aguirre ?MRN: [id number] ?DOB:[birth year], 51 y.o., male ?Today's Date: 07/03/2021 ? ?PCP: Pcp, No ?REFERRING PROVIDER: Aundra Dubin, PA-C ? ? PT End of Session - 07/03/21 1613   ? ? Visit Number 20   ? Number of Visits 27   ? Date for PT Re-Evaluation 08/16/21   ? Authorization Type [employer]   ? PT Start Time 1610   ? PT Stop Time 1655   ? PT Time Calculation (min) 45 min   ? Activity Tolerance Patient tolerated treatment well   ? Behavior During Therapy [facility] for tasks assessed/performed   ? ?  ?  ? ?  ? ? ? ? ? ? ? ? ? ? ? ? ? ? ? ? ? ? ? ?Past Medical History:  ?Diagnosis Date  ? Complication of anesthesia   ? Hard to wake up per patient  ? ?Past Surgical History:  ?Procedure Laterality Date  ? HERNIA REPAIR    ? KNEE SURGERY    ? SHOULDER SURGERY    ? TOTAL KNEE ARTHROPLASTY Left 04/02/2021  ? Procedure: LEFT TOTAL KNEE ARTHROPLASTY;  Surgeon: Leandrew Koyanagi, MD;  Location: [place];  Service: Orthopedics;  Laterality: Left;  ? ?Patient Active Problem List  ? Diagnosis Date Noted  ? Arthrofibrosis of knee joint, left 05/16/2021  ? Status post total knee replacement, left 04/02/2021  ? Primary osteoarthritis of left knee 02/02/2021  ? ? ?REFERRING DIAG: Hx of total knee replacement, left ? ?THERAPY DIAG:  ?Chronic pain of left knee ? ?Muscle weakness (generalized) ? ?Other abnormalities of gait and mobility ? ?Localized edema ? ?PERTINENT HISTORY: None ? ?PRECAUTIONS: None ? ? ?SUBJECTIVE: Patient reports his knee is feeling better today. He continues to report consistency with his exercises. ? ?PAIN:  ?Are you having pain? Yes ?NPRS scale: 3/10 ?Pain location: Knee ?Pain orientation: Left  ?PAIN TYPE: Surgical ?Pain description: Constant, stiff ?Aggravating factors: Moving the knee ?Relieving factors: Cold pack, rest, elevation ? ?PATIENT GOALS Pain relief and get back to work without limitation ?  ?  ?OBJECTIVE: (BOLDED MEASURE ASSESSED  THIS VISIT) ?PATIENT SURVEYS:  ?FOTO 56% functional status - 06/21/2021 (53% at evaluation) ?  ?  EDEMA ?  Right: 42 cm, Left: 42.5 cm ? ?Patient demonstrates slight increase edema lateral aspect of knee ? ?LE AROM/PROM: ?  ?AROM Left ?05/31/2021 Left ?06/08/2021 Left ?06/12/2021 Left ?06/21/2021  ?Knee flexion 84 (seated) -  85  ?Knee extension -8 -7 -6 -6  ?  ?PROM Left ?04/30/2021 Left ?05/25/2021 Left ?05/28/2021 Left ?05/30/2021  ?Knee flexion 65 88 90 88  ?Knee extension      -12         -7          -6  ?  ?LE MMT: ?  ?MMT Right ?04/30/2021 Left ?04/30/2021 Left ?06/21/2021 Left ?06/28/2021  ?Hip flexion 4 4    ?Hip extension 4- 4-    ?Hip abduction 4- 4-    ?Knee flexion 5 4- 4 4  ?Knee extension 5 4- 4 4  ?  ?Patient able to perform SLR without extension lag this visit (05/15/2021) ? ? ?TODAY'S TREATMENT ?07/03/2021: ?Therapeutic Exercise: ?Recumbent bike partial revolutions x 5 min to improve knee flexion, while taking subjective ?Seated knee flexion/extension AROM throughout seated manual ?Modified thomas stretch with passive knee flexion 3 x 30 sec - MHP applied to quad ?Prone hang with 10# and MHP applied to  posterior knee x 3 min ?Prone hamstring curl with 10# x 10 - hanging off edge of the table for pone knee extension stretch ?SL leg press (cybex) 60# 2 x 10 ?Knee extension machine SL 20# x 10 ?Manual Therapy: ?Seated tib-fem mobs to improve knee flexion and extension - MHP applied to quad while seated ?Seated and supine knee PROM for flexion and extension - MHP applied to quad while seated ?Supine patellofemoral mobs all directions - MHP applied to posterior knee ? ? ?06/28/2021: ?Therapeutic Exercise: ?Recumbent bike partial revolutions x 5 min to improve knee flexion, while taking subjective ?Seated knee flexion/extension AROM throughout seated manual ?Prone hamstring curl with 10# x 10 - lower hanging off edge of the table for pone knee extension stretch, MHP applied to hamstring ?Longsitting hamstring strength 3 x  30 sec ?SL leg press (omega) 55# 2 x 10 ?Forward step-up 8" box x 10 ?Forward heel tap 4" box x 10 ?Manual Therapy: ?Seated and supine tib-fem mobs to improve knee flexion and extension - MHP applied to quad while seated ?Seated and supine knee PROM for flexion and extension - MHP applied to quad while seated ?Supine patellofemoral mobs all directions - MHP applied to posterior knee ? ?06/26/2021: ?Therapeutic Exercise: ?Recumbent bike partial revolutions x 5 min to improve knee flexion, while taking subjective ?Seated knee flexion/extension AROM throughout seated manual ?Modified thomas stretch with passive knee flexion 3 x 30 sec - MHP applied to quad ?Longsitting hamstring strength 3 x 30 sec - MHP applied to quad ?Slant board calf stretch 3 x 30 sec ?Standing lunge knee flexion stretch on step 3 x 20 sec ?SL leg press (omega) 55# 2 x 10 ?Manual Therapy: ?Seated and supine tib-fem mobs to improve knee flexion and extension - MHP applied to quad while seated ?Seated and supine knee PROM for flexion and extension - MHP applied to quad while seated ?Supine patellofemoral mobs all directions - MHP applied to posterior knee ?STM to quad while in modified thomas stretch using roller ?  ?PATIENT EDUCATION:  ?Education details: HEP ?Person educated: Patient ?Education method: Explanation, Demonstration, Tactile cues, Verbal cues ?Education comprehension: verbalized understanding, returned demonstration, verbal cues required, tactile cues required, and needs further education ?  ?HOME EXERCISE PROGRAM: ?Access Code: [email or web address] ?  ?  ?ASSESSMENT: ?CLINICAL IMPRESSION: ?Patient tolerated therapy well with no adverse effects. Therapy continues to work on progressing knee motion and strengthening through full available range. He continues to demonstrate gross limitation in motion but is progressing well with his strengthening exercises. He is tolerating progressions in resistance for leg strengthening well. He does continues to  report increases in knee pain with end range stretching and when transitioning from knee flexed to extended position. No change to HEP. Patient would benefit from continued skilled PT to progress mobility and strength in order to reduce pain and maximize functional ability. ? ?Objective impairments include Abnormal gait, decreased activity tolerance, decreased balance, difficulty walking, decreased ROM, decreased strength, and pain. ?  ?  ?GOALS: ?Goals reviewed with patient? Yes ?  ?SHORT TERM GOALS: ?  ?STG Name Target Date Goal status  ?1 Patient will be I with initial HEP in order to progress with therapy. ?Baseline: independent with initial HEP 05/28/2021 MET  ?2 PT will review FOTO with patient by 3rd visit in order to understand expected progress and outcome with therapy. ?Baseline: reviewed with patient 05/28/2021 MET  ?3 Patient will demonstrate active knee motion 5-90 deg in order to improve gait ?Baseline:  8-75 deg ?06/21/2021: 6-85 deg 07/18/2021 ONGOING  ?4 Patient will ambulate community level without AD to improve access and return to work without limitations ?Baseline: patient ambulating without assistive device 05/28/2021 MET  ?  ?LONG TERM GOALS:  ?  ?LTG Name Target Date Goal status  ?1 Patient will be I with final HEP to maintain progress from PT. ?Baseline: provided at eval ?06/21/2021: progressing with HEP 08/16/2021 ONGOING  ?2 Patient will report >/= 66% status on FOTO to indicate improved functional ability. ?Baseline: 53% ?06/21/2021: 56% 08/16/2021 ONGOING  ?3 Patient will demonstrate active knee motion 2-100 deg to all normal movement mechanics with transfers and stairs ?Baseline: 15-60 deg at eval ?06/21/2021: 6-85 deg 08/16/2021 MODIFIED  ?4 Patient will exhibit knee strength 5/5 MMT to improve standing tolerance reduce pain with activity ?Baseline: grossly 4-/5 in available range, extension lag with SLR ?06/21/2021: grossly 4/5 MMT 08/16/2021 ONGOING  ?5 Patient will report pain </= 2/10 with all standing  and walking activity to maximize functional ability and allow full return to work ?Baseline: 7/10 ?06/21/2021: 4/10  08/16/2021 ONGOING  ?  ?  ?PLAN: ?PT FREQUENCY: 1-2x/week ?  ?PT DURATION: 8 weeks ?  ?PLAN

## 2021-07-03 ENCOUNTER — Ambulatory Visit: Payer: PRIVATE HEALTH INSURANCE | Admitting: Physical Therapy

## 2021-07-03 ENCOUNTER — Encounter: Payer: Self-pay | Admitting: Physical Therapy

## 2021-07-03 ENCOUNTER — Other Ambulatory Visit: Payer: Self-pay

## 2021-07-03 DIAGNOSIS — M6281 Muscle weakness (generalized): Secondary | ICD-10-CM

## 2021-07-03 DIAGNOSIS — M25562 Pain in left knee: Secondary | ICD-10-CM | POA: Diagnosis not present

## 2021-07-03 DIAGNOSIS — G8929 Other chronic pain: Secondary | ICD-10-CM

## 2021-07-03 DIAGNOSIS — R2689 Other abnormalities of gait and mobility: Secondary | ICD-10-CM

## 2021-07-03 DIAGNOSIS — R600 Localized edema: Secondary | ICD-10-CM

## 2021-07-04 NOTE — Therapy (Signed)
?OUTPATIENT PHYSICAL THERAPY TREATMENT NOTE ? ? ?Patient Name: Carlos Aguirre ?MRN: [id number] ?DOB:[birth year], 51 y.o., male ?Today's Date: 07/05/2021 ? ?PCP: Pcp, No ?REFERRING PROVIDER: Aundra Dubin, PA-C ? ? PT End of Session - 07/05/21 1128   ? ? Visit Number 21   ? Number of Visits 27   ? Date for PT Re-Evaluation 08/16/21   ? Authorization Type [employer]   ? Progress Note Due on Visit 10   ? PT Start Time 1130   ? PT Stop Time 3354   ? PT Time Calculation (min) 45 min   ? Activity Tolerance Patient tolerated treatment well   ? Behavior During Therapy [facility] for tasks assessed/performed   ? ?  ?  ? ?  ? ? ? ? ? ? ? ? ? ? ? ? ? ? ? ? ? ? ? ? ?Past Medical History:  ?Diagnosis Date  ? Complication of anesthesia   ? Hard to wake up per patient  ? ?Past Surgical History:  ?Procedure Laterality Date  ? HERNIA REPAIR    ? KNEE SURGERY    ? SHOULDER SURGERY    ? TOTAL KNEE ARTHROPLASTY Left 04/02/2021  ? Procedure: LEFT TOTAL KNEE ARTHROPLASTY;  Surgeon: Leandrew Koyanagi, MD;  Location: [place];  Service: Orthopedics;  Laterality: Left;  ? ?Patient Active Problem List  ? Diagnosis Date Noted  ? Arthrofibrosis of knee joint, left 05/16/2021  ? Status post total knee replacement, left 04/02/2021  ? Primary osteoarthritis of left knee 02/02/2021  ? ? ?REFERRING DIAG: Hx of total knee replacement, left ? ?THERAPY DIAG:  ?Chronic pain of left knee ? ?Muscle weakness (generalized) ? ?Other abnormalities of gait and mobility ? ?Localized edema ? ?PERTINENT HISTORY: None ? ?PRECAUTIONS: None ? ? ?SUBJECTIVE: Patient reports he continues to do well with no new complaints. ? ?PAIN:  ?Are you having pain? Yes ?NPRS scale: 3/10 ?Pain location: Knee ?Pain orientation: Left  ?PAIN TYPE: Surgical ?Pain description: Constant, stiff ?Aggravating factors: Moving the knee ?Relieving factors: Cold pack, rest, elevation ? ?PATIENT GOALS Pain relief and get back to work without limitation ?  ?  ?OBJECTIVE: (BOLDED MEASURE ASSESSED THIS  VISIT) ?PATIENT SURVEYS:  ?FOTO 56% functional status - 06/21/2021 (53% at evaluation) ?  ?  EDEMA ?  Right: 42 cm, Left: 42.5 cm ? ?Patient demonstrates slight increase edema lateral aspect of knee ? ?LE AROM/PROM: ?  ?AROM Left ?05/31/2021 Left ?06/08/2021 Left ?06/12/2021 Left ?06/21/2021  ?Knee flexion 84 (seated) -  85  ?Knee extension -8 -7 -6 -6  ?  ?PROM Left ?04/30/2021 Left ?05/25/2021 Left ?05/28/2021 Left ?05/30/2021  ?Knee flexion 65 88 90 88  ?Knee extension      -12         -7          -6  ?  ?LE MMT: ?  ?MMT Right ?04/30/2021 Left ?04/30/2021 Left ?06/21/2021 Left ?06/28/2021  ?Hip flexion 4 4    ?Hip extension 4- 4-    ?Hip abduction 4- 4-    ?Knee flexion 5 4- 4 4  ?Knee extension 5 4- 4 4  ?  ?Patient able to perform SLR without extension lag this visit (05/15/2021) ? ? ?TODAY'S TREATMENT ?07/05/2021: ?Therapeutic Exercise: ?Recumbent bike partial revolutions x 5 min to improve knee flexion, while taking subjective ?Seated knee flexion/extension AROM throughout seated manual ?Prone hamstring curl with 10# x 10 ?SL leg press (cybex seat 7) 60# 2 x 10 ?Standing lunge  knee flexion stretch on step 5 x 10 sec ?Knee extension machine SL 20# x 10 ?Manual Therapy: ?Seated tib-fem mobs to improve knee flexion and extension ?Seated and supine knee PROM for flexion and extension ?Contract relax knee flexion stretching in seated ?Supine patellofemoral mobs all directions ? ? ?07/03/2021: ?Therapeutic Exercise: ?Recumbent bike partial revolutions x 5 min to improve knee flexion, while taking subjective ?Seated knee flexion/extension AROM throughout seated manual ?Modified thomas stretch with passive knee flexion 3 x 30 sec - MHP applied to quad ?Prone hang with 10# and MHP applied to posterior knee x 3 min ?Prone hamstring curl with 10# x 10 - hanging off edge of the table for pone knee extension stretch ?SL leg press (cybex) 60# 2 x 10 ?Knee extension machine SL 20# x 10 ?Manual Therapy: ?Seated tib-fem mobs to improve knee  flexion and extension - MHP applied to quad while seated ?Seated and supine knee PROM for flexion and extension - MHP applied to quad while seated ?Supine patellofemoral mobs all directions - MHP applied to posterior knee ? ?06/28/2021: ?Therapeutic Exercise: ?Recumbent bike partial revolutions x 5 min to improve knee flexion, while taking subjective ?Seated knee flexion/extension AROM throughout seated manual ?Prone hamstring curl with 10# x 10 - lower hanging off edge of the table for pone knee extension stretch, MHP applied to hamstring ?Longsitting hamstring strength 3 x 30 sec ?SL leg press (omega) 55# 2 x 10 ?Forward step-up 8" box x 10 ?Forward heel tap 4" box x 10 ?Manual Therapy: ?Seated and supine tib-fem mobs to improve knee flexion and extension - MHP applied to quad while seated ?Seated and supine knee PROM for flexion and extension - MHP applied to quad while seated ?Supine patellofemoral mobs all directions - MHP applied to posterior knee ?  ?PATIENT EDUCATION:  ?Education details: HEP ?Person educated: Patient ?Education method: Explanation, Demonstration, Tactile cues, Verbal cues ?Education comprehension: verbalized understanding, returned demonstration, verbal cues required, tactile cues required, and needs further education ?  ?HOME EXERCISE PROGRAM: ?Access Code: [email or web address] ?  ?  ?ASSESSMENT: ?CLINICAL IMPRESSION: ?Patient tolerated therapy well with no adverse effects. Therapy continues to focus on progressing knee mobility and strength. He seems to be plateauing with his knee motion but is progressing with knee strength. He does not overall improvement in his daily mobility but continues to have increased tightness in the morning that gradually improves throughout the day. He was encouraged to continue his stretching routine, he has not been able to find a stationary bike a this time. Patient would benefit from continued skilled PT to progress mobility and strength in order to reduce pain and  maximize functional ability. ? ?Objective impairments include Abnormal gait, decreased activity tolerance, decreased balance, difficulty walking, decreased ROM, decreased strength, and pain. ?  ?  ?GOALS: ?Goals reviewed with patient? Yes ?  ?SHORT TERM GOALS: ?  ?STG Name Target Date Goal status  ?1 Patient will be I with initial HEP in order to progress with therapy. ?Baseline: independent with initial HEP 05/28/2021 MET  ?2 PT will review FOTO with patient by 3rd visit in order to understand expected progress and outcome with therapy. ?Baseline: reviewed with patient 05/28/2021 MET  ?3 Patient will demonstrate active knee motion 5-90 deg in order to improve gait ?Baseline: 8-75 deg ?06/21/2021: 6-85 deg 07/18/2021 ONGOING  ?4 Patient will ambulate community level without AD to improve access and return to work without limitations ?Baseline: patient ambulating without assistive device 05/28/2021 MET  ?  ?  LONG TERM GOALS:  ?  ?LTG Name Target Date Goal status  ?1 Patient will be I with final HEP to maintain progress from PT. ?Baseline: provided at eval ?06/21/2021: progressing with HEP 08/16/2021 ONGOING  ?2 Patient will report >/= 66% status on FOTO to indicate improved functional ability. ?Baseline: 53% ?06/21/2021: 56% 08/16/2021 ONGOING  ?3 Patient will demonstrate active knee motion 2-100 deg to all normal movement mechanics with transfers and stairs ?Baseline: 15-60 deg at eval ?06/21/2021: 6-85 deg 08/16/2021 MODIFIED  ?4 Patient will exhibit knee strength 5/5 MMT to improve standing tolerance reduce pain with activity ?Baseline: grossly 4-/5 in available range, extension lag with SLR ?06/21/2021: grossly 4/5 MMT 08/16/2021 ONGOING  ?5 Patient will report pain </= 2/10 with all standing and walking activity to maximize functional ability and allow full return to work ?Baseline: 7/10 ?06/21/2021: 4/10  08/16/2021 ONGOING  ?  ?  ?PLAN: ?PT FREQUENCY: 1-2x/week ?  ?PT DURATION: 8 weeks ?  ?PLANNED INTERVENTIONS: Therapeutic exercises,  Therapeutic activity, Neuro Muscular re-education, Balance training, Gait training, Patient/Family education, Joint mobilization, Stair training, Aquatic Therapy, Dry Needling, Cryotherapy, Moist heat, Taping,

## 2021-07-05 ENCOUNTER — Encounter: Payer: Self-pay | Admitting: Physical Therapy

## 2021-07-05 ENCOUNTER — Ambulatory Visit: Payer: PRIVATE HEALTH INSURANCE | Admitting: Physical Therapy

## 2021-07-05 ENCOUNTER — Other Ambulatory Visit: Payer: Self-pay

## 2021-07-05 DIAGNOSIS — M6281 Muscle weakness (generalized): Secondary | ICD-10-CM

## 2021-07-05 DIAGNOSIS — M25562 Pain in left knee: Secondary | ICD-10-CM | POA: Diagnosis not present

## 2021-07-05 DIAGNOSIS — R600 Localized edema: Secondary | ICD-10-CM

## 2021-07-05 DIAGNOSIS — R2689 Other abnormalities of gait and mobility: Secondary | ICD-10-CM

## 2021-07-05 DIAGNOSIS — G8929 Other chronic pain: Secondary | ICD-10-CM

## 2021-07-07 NOTE — Therapy (Signed)
?OUTPATIENT PHYSICAL THERAPY TREATMENT NOTE ? ? ?Patient Name: Carlos Aguirre ?MRN: [id number] ?DOB:11/28/1970, 52 y.o., male ?Today's Date: 07/09/2021 ? ?PCP: Pcp, No ?REFERRING PROVIDER: Aundra Dubin, PA-C ? ? PT End of Session - 07/09/21 1524   ? ? Visit Number 22   ? Number of Visits 27   ? Date for PT Re-Evaluation 08/16/21   ? Authorization Type [employer]   ? Progress Note Due on Visit 10   ? PT Start Time 1522   ? PT Stop Time 7322   ? PT Time Calculation (min) 43 min   ? Activity Tolerance Patient tolerated treatment well   ? Behavior During Therapy [facility] for tasks assessed/performed   ? ?  ?  ? ?  ? ? ? ? ? ? ? ? ? ? ? ? ? ? ? ? ? ? ? ? ? ?Past Medical History:  ?Diagnosis Date  ? Complication of anesthesia   ? Hard to wake up per patient  ? ?Past Surgical History:  ?Procedure Laterality Date  ? HERNIA REPAIR    ? KNEE SURGERY    ? SHOULDER SURGERY    ? TOTAL KNEE ARTHROPLASTY Left 04/02/2021  ? Procedure: LEFT TOTAL KNEE ARTHROPLASTY;  Surgeon: Leandrew Koyanagi, MD;  Location: [place];  Service: Orthopedics;  Laterality: Left;  ? ?Patient Active Problem List  ? Diagnosis Date Noted  ? Arthrofibrosis of knee joint, left 05/16/2021  ? Status post total knee replacement, left 04/02/2021  ? Primary osteoarthritis of left knee 02/02/2021  ? ? ?REFERRING DIAG: Hx of total knee replacement, left ? ?THERAPY DIAG:  ?Chronic pain of left knee ? ?Muscle weakness (generalized) ? ?Other abnormalities of gait and mobility ? ?Localized edema ? ?PERTINENT HISTORY: None ? ?PRECAUTIONS: None ? ? ?SUBJECTIVE: Patient reports he can stand throughout the day with only a dull ache. He can also lay on his side and his knee isn't hurting.  ? ?PAIN:  ?Are you having pain? Yes ?NPRS scale: 3/10 ?Pain location: Knee ?Pain orientation: Left  ?PAIN TYPE: Surgical ?Pain description: Constant, stiff ?Aggravating factors: Moving the knee ?Relieving factors: Cold pack, rest, elevation ? ?PATIENT GOALS Pain relief and get back to work  without limitation ?  ?  ?OBJECTIVE: ?PATIENT SURVEYS:  ?FOTO 56% functional status - 06/21/2021 (53% at evaluation) ?  ?  EDEMA ?  Right: 42 cm, Left: 42.5 cm ? ?Patient demonstrates slight increase edema lateral aspect of knee ? ?LE AROM/PROM: ?  ?AROM Left ?05/31/2021 Left ?06/08/2021 Left ?06/12/2021 Left ?06/21/2021 Left ?07/09/2021  ?Knee flexion 84 (seated) -  85 85  ?Knee extension -8 -7 -6 -6 -6  ?  ?PROM Left ?04/30/2021 Left ?05/25/2021 Left ?05/28/2021 Left ?05/30/2021  ?Knee flexion 65 88 90 88  ?Knee extension      -12         -7          -6  ?  ?LE MMT: ?  ?MMT Right ?04/30/2021 Left ?04/30/2021 Left ?06/21/2021 Left ?06/28/2021  ?Hip flexion 4 4    ?Hip extension 4- 4-    ?Hip abduction 4- 4-    ?Knee flexion 5 4- 4 4  ?Knee extension 5 4- 4 4  ?  ?Patient able to perform SLR without extension lag this visit (05/15/2021) ? ? ?TODAY'S TREATMENT ?07/09/2021: ?Therapeutic Exercise: ?Recumbent bike partial revolutions x 5 min to improve knee flexion, while taking subjective ?Seated knee flexion/extension AROM throughout seated manual ?Standing lunge knee flexion  stretch on step 5 x 10 sec ?SL leg press (cybex seat 7) 60# 2 x 10 ?Forward step-up 8" 2 x 10 ?Knee extension machine SL 20# 2 x 10 ?Manual Therapy: ?Seated tib-fem mobs to improve knee flexion and extension ?Seated and supine knee PROM for flexion and extension ?Contract relax knee flexion stretching in seated ?Supine patellofemoral mobs all directions ? ? ?07/05/2021: ?Therapeutic Exercise: ?Recumbent bike partial revolutions x 5 min to improve knee flexion, while taking subjective ?Seated knee flexion/extension AROM throughout seated manual ?Prone hamstring curl with 10# x 10 ?SL leg press (cybex seat 7) 60# 2 x 10 ?Standing lunge knee flexion stretch on step 5 x 10 sec ?Knee extension machine SL 20# x 10 ?Manual Therapy: ?Seated tib-fem mobs to improve knee flexion and extension ?Seated and supine knee PROM for flexion and extension ?Contract relax knee flexion  stretching in seated ?Supine patellofemoral mobs all directions ? ?07/03/2021: ?Therapeutic Exercise: ?Recumbent bike partial revolutions x 5 min to improve knee flexion, while taking subjective ?Seated knee flexion/extension AROM throughout seated manual ?Modified thomas stretch with passive knee flexion 3 x 30 sec - MHP applied to quad ?Prone hang with 10# and MHP applied to posterior knee x 3 min ?Prone hamstring curl with 10# x 10 - hanging off edge of the table for pone knee extension stretch ?SL leg press (cybex) 60# 2 x 10 ?Knee extension machine SL 20# x 10 ?Manual Therapy: ?Seated tib-fem mobs to improve knee flexion and extension - MHP applied to quad while seated ?Seated and supine knee PROM for flexion and extension - MHP applied to quad while seated ?Supine patellofemoral mobs all directions - MHP applied to posterior knee ?  ?PATIENT EDUCATION:  ?Education details: HEP ?Person educated: Patient ?Education method: Explanation, Demonstration, Tactile cues, Verbal cues ?Education comprehension: verbalized understanding, returned demonstration, verbal cues required, tactile cues required, and needs further education ?  ?HOME EXERCISE PROGRAM: ?Access Code: [email or web address] ?  ?  ?ASSESSMENT: ?CLINICAL IMPRESSION: ?Patient tolerated therapy well with no adverse effects. No change in motion noted this visit. Therapy continues to focus on progressing knee motion and strength but prioritizing strengthening through available range. He is progressing with his strengthening and states that activity throughout the day has gotten easier. Patient would benefit from continued skilled PT to progress mobility and strength in order to reduce pain and maximize functional ability. ? ?Objective impairments include Abnormal gait, decreased activity tolerance, decreased balance, difficulty walking, decreased ROM, decreased strength, and pain. ?  ?  ?GOALS: ?Goals reviewed with patient? Yes ?  ?SHORT TERM GOALS: ?  ?STG Name Target  Date Goal status  ?1 Patient will be I with initial HEP in order to progress with therapy. ?Baseline: independent with initial HEP 05/28/2021 MET  ?2 PT will review FOTO with patient by 3rd visit in order to understand expected progress and outcome with therapy. ?Baseline: reviewed with patient 05/28/2021 MET  ?3 Patient will demonstrate active knee motion 5-90 deg in order to improve gait ?Baseline: 8-75 deg ?06/21/2021: 6-85 deg 07/18/2021 ONGOING  ?4 Patient will ambulate community level without AD to improve access and return to work without limitations ?Baseline: patient ambulating without assistive device 05/28/2021 MET  ?  ?LONG TERM GOALS:  ?  ?LTG Name Target Date Goal status  ?1 Patient will be I with final HEP to maintain progress from PT. ?Baseline: provided at eval ?06/21/2021: progressing with HEP 08/16/2021 ONGOING  ?2 Patient will report >/= 66% status on FOTO to indicate   improved functional ability. ?Baseline: 53% ?06/21/2021: 56% 08/16/2021 ONGOING  ?3 Patient will demonstrate active knee motion 2-100 deg to all normal movement mechanics with transfers and stairs ?Baseline: 15-60 deg at eval ?06/21/2021: 6-85 deg 08/16/2021 MODIFIED  ?4 Patient will exhibit knee strength 5/5 MMT to improve standing tolerance reduce pain with activity ?Baseline: grossly 4-/5 in available range, extension lag with SLR ?06/21/2021: grossly 4/5 MMT 08/16/2021 ONGOING  ?5 Patient will report pain </= 2/10 with all standing and walking activity to maximize functional ability and allow full return to work ?Baseline: 7/10 ?06/21/2021: 4/10  08/16/2021 ONGOING  ?  ?  ?PLAN: ?PT FREQUENCY: 1-2x/week ?  ?PT DURATION: 8 weeks ?  ?PLANNED INTERVENTIONS: Therapeutic exercises, Therapeutic activity, Neuro Muscular re-education, Balance training, Gait training, Patient/Family education, Joint mobilization, Stair training, Aquatic Therapy, Dry Needling, Cryotherapy, Moist heat, Taping, Vasopneumatic device, and Manual therapy ?  ?PLAN FOR NEXT SESSION:  Review HEP and progress PRN, manual/stretching for knee flexion and extension, progress strength to closed chain as tolerated, vaso post session, balance and gait training. ?  ? ? ?Hilda Blades, PT, DPT, LAT,

## 2021-07-09 ENCOUNTER — Other Ambulatory Visit: Payer: Self-pay

## 2021-07-09 ENCOUNTER — Ambulatory Visit: Payer: PRIVATE HEALTH INSURANCE | Admitting: Physical Therapy

## 2021-07-09 ENCOUNTER — Encounter: Payer: Self-pay | Admitting: Physical Therapy

## 2021-07-09 DIAGNOSIS — G8929 Other chronic pain: Secondary | ICD-10-CM

## 2021-07-09 DIAGNOSIS — M6281 Muscle weakness (generalized): Secondary | ICD-10-CM

## 2021-07-09 DIAGNOSIS — M25562 Pain in left knee: Secondary | ICD-10-CM | POA: Diagnosis not present

## 2021-07-09 DIAGNOSIS — R600 Localized edema: Secondary | ICD-10-CM

## 2021-07-09 DIAGNOSIS — R2689 Other abnormalities of gait and mobility: Secondary | ICD-10-CM

## 2021-07-09 NOTE — Therapy (Incomplete)
?OUTPATIENT PHYSICAL THERAPY TREATMENT NOTE ? ? ?Patient Name: Carlos Aguirre ?MRN: [id number] ?DOB:10-11-1970, 51 y.o., male ?Today's Date: 07/09/2021 ? ?PCP: Pcp, No ?REFERRING PROVIDER: Aundra Dubin, PA-C ? ? ? ? ? ? ? ? ? ? ? ? ? ? ? ? ? ? ? ? ? ? ? ?Past Medical History:  ?Diagnosis Date  ? Complication of anesthesia   ? Hard to wake up per patient  ? ?Past Surgical History:  ?Procedure Laterality Date  ? HERNIA REPAIR    ? KNEE SURGERY    ? SHOULDER SURGERY    ? TOTAL KNEE ARTHROPLASTY Left 04/02/2021  ? Procedure: LEFT TOTAL KNEE ARTHROPLASTY;  Surgeon: Leandrew Koyanagi, MD;  Location: [place];  Service: Orthopedics;  Laterality: Left;  ? ?Patient Active Problem List  ? Diagnosis Date Noted  ? Arthrofibrosis of knee joint, left 05/16/2021  ? Status post total knee replacement, left 04/02/2021  ? Primary osteoarthritis of left knee 02/02/2021  ? ? ?REFERRING DIAG: Hx of total knee replacement, left ? ?THERAPY DIAG:  ?No diagnosis found. ? ?PERTINENT HISTORY: None ? ?PRECAUTIONS: None ? ? ?SUBJECTIVE: Patient reports he can stand throughout the day with only a dull ache. He can also lay on his side and his knee isn't hurting.  ? ?PAIN:  ?Are you having pain? Yes ?NPRS scale: 3/10 ?Pain location: Knee ?Pain orientation: Left  ?PAIN TYPE: Surgical ?Pain description: Constant, stiff ?Aggravating factors: Moving the knee ?Relieving factors: Cold pack, rest, elevation ? ?PATIENT GOALS Pain relief and get back to work without limitation ?  ?  ?OBJECTIVE: ?PATIENT SURVEYS:  ?FOTO 56% functional status - 06/21/2021 (53% at evaluation) ?  ?  EDEMA ?  Right: 42 cm, Left: 42.5 cm ? ?Patient demonstrates slight increase edema lateral aspect of knee ? ?LE AROM/PROM: ?  ?AROM Left ?05/31/2021 Left ?06/08/2021 Left ?06/12/2021 Left ?06/21/2021 Left ?07/09/2021  ?Knee flexion 84 (seated) -  85 85  ?Knee extension -8 -7 -6 -6 -6  ?  ?PROM Left ?04/30/2021 Left ?05/25/2021 Left ?05/28/2021 Left ?05/30/2021  ?Knee flexion 65 88 90 88  ?Knee  extension      -12         -7          -6  ?  ?LE MMT: ?  ?MMT Right ?04/30/2021 Left ?04/30/2021 Left ?06/21/2021 Left ?06/28/2021  ?Hip flexion 4 4    ?Hip extension 4- 4-    ?Hip abduction 4- 4-    ?Knee flexion 5 4- 4 4  ?Knee extension 5 4- 4 4  ?  ?Patient able to perform SLR without extension lag this visit (05/15/2021) ? ? ?TODAY'S TREATMENT ?07/09/2021: ?Therapeutic Exercise: ?Recumbent bike partial revolutions x 5 min to improve knee flexion, while taking subjective ?Seated knee flexion/extension AROM throughout seated manual ?Standing lunge knee flexion stretch on step 5 x 10 sec ?SL leg press (cybex seat 7) 60# 2 x 10 ?Forward step-up 8" 2 x 10 ?Knee extension machine SL 20# 2 x 10 ?Manual Therapy: ?Seated tib-fem mobs to improve knee flexion and extension ?Seated and supine knee PROM for flexion and extension ?Contract relax knee flexion stretching in seated ?Supine patellofemoral mobs all directions ? ? ?07/09/2021: ?Therapeutic Exercise: ?Recumbent bike partial revolutions x 5 min to improve knee flexion, while taking subjective ?Seated knee flexion/extension AROM throughout seated manual ?Standing lunge knee flexion stretch on step 5 x 10 sec ?SL leg press (cybex seat 7) 60# 2 x 10 ?Forward step-up 8"  2 x 10 ?Knee extension machine SL 20# 2 x 10 ?Manual Therapy: ?Seated tib-fem mobs to improve knee flexion and extension ?Seated and supine knee PROM for flexion and extension ?Contract relax knee flexion stretching in seated ?Supine patellofemoral mobs all directions ? ?07/05/2021: ?Therapeutic Exercise: ?Recumbent bike partial revolutions x 5 min to improve knee flexion, while taking subjective ?Seated knee flexion/extension AROM throughout seated manual ?Prone hamstring curl with 10# x 10 ?SL leg press (cybex seat 7) 60# 2 x 10 ?Standing lunge knee flexion stretch on step 5 x 10 sec ?Knee extension machine SL 20# x 10 ?Manual Therapy: ?Seated tib-fem mobs to improve knee flexion and extension ?Seated and  supine knee PROM for flexion and extension ?Contract relax knee flexion stretching in seated ?Supine patellofemoral mobs all directions ?  ?PATIENT EDUCATION:  ?Education details: HEP ?Person educated: Patient ?Education method: Explanation, Demonstration, Tactile cues, Verbal cues ?Education comprehension: verbalized understanding, returned demonstration, verbal cues required, tactile cues required, and needs further education ?  ?HOME EXERCISE PROGRAM: ?Access Code: [email or web address] ?  ?  ?ASSESSMENT: ?CLINICAL IMPRESSION: ?Patient tolerated therapy well with no adverse effects. *** Patient would benefit from continued skilled PT to progress mobility and strength in order to reduce pain and maximize functional ability. ? ?No change in motion noted this visit. Therapy continues to focus on progressing knee motion and strength but prioritizing strengthening through available range. He is progressing with his strengthening and states that activity throughout the day has gotten easier.  ? ?Objective impairments include Abnormal gait, decreased activity tolerance, decreased balance, difficulty walking, decreased ROM, decreased strength, and pain. ?  ?  ?GOALS: ?Goals reviewed with patient? Yes ?  ?SHORT TERM GOALS: ?  ?STG Name Target Date Goal status  ?1 Patient will be I with initial HEP in order to progress with therapy. ?Baseline: independent with initial HEP 05/28/2021 MET  ?2 PT will review FOTO with patient by 3rd visit in order to understand expected progress and outcome with therapy. ?Baseline: reviewed with patient 05/28/2021 MET  ?3 Patient will demonstrate active knee motion 5-90 deg in order to improve gait ?Baseline: 8-75 deg ?06/21/2021: 6-85 deg 07/18/2021 ONGOING  ?4 Patient will ambulate community level without AD to improve access and return to work without limitations ?Baseline: patient ambulating without assistive device 05/28/2021 MET  ?  ?LONG TERM GOALS:  ?  ?LTG Name Target Date Goal status  ?1 Patient will  be I with final HEP to maintain progress from PT. ?Baseline: provided at eval ?06/21/2021: progressing with HEP 08/16/2021 ONGOING  ?2 Patient will report >/= 66% status on FOTO to indicate improved functional ability. ?Baseline: 53% ?06/21/2021: 56% 08/16/2021 ONGOING  ?3 Patient will demonstrate active knee motion 2-100 deg to all normal movement mechanics with transfers and stairs ?Baseline: 15-60 deg at eval ?06/21/2021: 6-85 deg 08/16/2021 MODIFIED  ?4 Patient will exhibit knee strength 5/5 MMT to improve standing tolerance reduce pain with activity ?Baseline: grossly 4-/5 in available range, extension lag with SLR ?06/21/2021: grossly 4/5 MMT 08/16/2021 ONGOING  ?5 Patient will report pain </= 2/10 with all standing and walking activity to maximize functional ability and allow full return to work ?Baseline: 7/10 ?06/21/2021: 4/10  08/16/2021 ONGOING  ?  ?  ?PLAN: ?PT FREQUENCY: 1-2x/week ?  ?PT DURATION: 8 weeks ?  ?PLANNED INTERVENTIONS: Therapeutic exercises, Therapeutic activity, Neuro Muscular re-education, Balance training, Gait training, Patient/Family education, Joint mobilization, Stair training, Aquatic Therapy, Dry Needling, Cryotherapy, Moist heat, Taping, Vasopneumatic device, and Manual therapy ?  ?  PLAN FOR NEXT SESSION: Review HEP and progress PRN, manual/stretching for knee flexion and extension, progress strength to closed chain as tolerated, vaso post session, balance and gait training. ?  ? ? ?Hilda Blades, PT, DPT, LAT, ATC ?07/09/21  4:47 PM ?Phone: [phone] ?Fax: [phone] ? ? ? ? ?   ?

## 2021-07-10 ENCOUNTER — Encounter: Payer: PRIVATE HEALTH INSURANCE | Admitting: Physical Therapy

## 2021-07-11 ENCOUNTER — Ambulatory Visit: Payer: PRIVATE HEALTH INSURANCE | Admitting: Physical Therapy

## 2021-07-17 NOTE — Therapy (Signed)
?OUTPATIENT PHYSICAL THERAPY TREATMENT NOTE ? ? ?Patient Name: Carlos Aguirre ?MRN: [id number] ?DOB:26-Apr-1970, 51 y.o., male ?Today's Date: 07/19/2021 ? ?PCP: Pcp, No ?REFERRING PROVIDER: Aundra Dubin, PA-C ? ? PT End of Session - 07/19/21 1005   ? ? Visit Number 23   ? Number of Visits 27   ? Date for PT Re-Evaluation 08/16/21   ? Authorization Type [employer]   ? Progress Note Due on Visit 10   ? PT Start Time 1001   ? PT Stop Time 1045   ? PT Time Calculation (min) 44 min   ? Activity Tolerance Patient tolerated treatment well   ? Behavior During Therapy [facility] for tasks assessed/performed   ? ?  ?  ? ?  ? ? ? ? ? ? ? ? ? ? ? ? ? ? ? ? ? ? ? ? ? ? ?Past Medical History:  ?Diagnosis Date  ? Complication of anesthesia   ? Hard to wake up per patient  ? ?Past Surgical History:  ?Procedure Laterality Date  ? HERNIA REPAIR    ? KNEE SURGERY    ? SHOULDER SURGERY    ? TOTAL KNEE ARTHROPLASTY Left 04/02/2021  ? Procedure: LEFT TOTAL KNEE ARTHROPLASTY;  Surgeon: Leandrew Koyanagi, MD;  Location: [place];  Service: Orthopedics;  Laterality: Left;  ? ?Patient Active Problem List  ? Diagnosis Date Noted  ? Arthrofibrosis of knee joint, left 05/16/2021  ? Status post total knee replacement, left 04/02/2021  ? Primary osteoarthritis of left knee 02/02/2021  ? ? ?REFERRING DIAG: Hx of total knee replacement, left ? ?THERAPY DIAG:  ?Chronic pain of left knee ? ?Muscle weakness (generalized) ? ?Other abnormalities of gait and mobility ? ?Localized edema ? ?PERTINENT HISTORY: None ? ?PRECAUTIONS: None ? ? ?SUBJECTIVE: Patient reports his knee has been feeling better but his knee does stay tight. He reports he has been improving in how he feels in the morning but does get tight when he walks for longer periods. He doesn't get sharp pains at night, it is more achy. He also reports he can sit on the couch with his knee bent more comfortably. ? ?PAIN:  ?Are you having pain? Yes ?NPRS scale: 2/10 ?Pain location: Knee ?Pain orientation:  Left  ?PAIN TYPE: Surgical ?Pain description: Constant, stiff, sore ?Aggravating factors: Moving the knee ?Relieving factors: Cold pack, rest, elevation ? ?PATIENT GOALS Pain relief and get back to work without limitation ?  ?  ?OBJECTIVE: ?PATIENT SURVEYS:  ?FOTO 56% functional status - 06/21/2021 (53% at evaluation) ?  ?  EDEMA ?  Right: 42 cm, Left: 42.5 cm ? ?Patient demonstrates slight increase edema lateral aspect of knee ? ?LE AROM/PROM: ?  ?AROM Left ?07/09/2021 Left ?07/19/2021  ?Knee flexion 85 85  ?Knee extension -6   ?  ?PROM Left ?05/30/2021 Left ?07/19/2021  ?Knee flexion 88 90  ?Knee extension         -6   ?  ?LE MMT: ?  ?MMT Right ?04/30/2021 Left ?04/30/2021 Left ?06/21/2021 Left ?06/28/2021  ?Hip flexion 4 4    ?Hip extension 4- 4-    ?Hip abduction 4- 4-    ?Knee flexion 5 4- 4 4  ?Knee extension 5 4- 4 4  ?  ?Patient able to perform SLR without extension lag this visit (05/15/2021) ? ? ?TODAY'S TREATMENT ?07/19/2021: ?Therapeutic Exercise: ?Recumbent bike partial revolutions x 5 min to improve knee flexion, while taking subjective ?Seated knee flexion/extension AROM throughout seated  manual ?Standing TKE with green x 20 ?Standing lunge knee flexion stretch on step 5 x 10 sec ?SL leg press (cybex) seat 7 60# x 10, seat 6 60# x 10 ?Forward step-up 8" x 10 ?Knee extension machine SL 20# 2 x 10 ?Manual Therapy: ?Seated tib-fem mobs to improve knee flexion and extension ?Seated and supine knee PROM for flexion and extension ?Contract relax knee flexion stretching in seated ?Supine patellofemoral mobs all directions ?Modified thomas stretch with passive knee flexion 2 x 30 sec ? ? ?07/09/2021: ?Therapeutic Exercise: ?Recumbent bike partial revolutions x 5 min to improve knee flexion, while taking subjective ?Seated knee flexion/extension AROM throughout seated manual ?Standing lunge knee flexion stretch on step 5 x 10 sec ?SL leg press (cybex seat 7) 60# 2 x 10 ?Forward step-up 8" 2 x 10 ?Knee extension machine SL 20#  2 x 10 ?Manual Therapy: ?Seated tib-fem mobs to improve knee flexion and extension ?Seated and supine knee PROM for flexion and extension ?Contract relax knee flexion stretching in seated ?Supine patellofemoral mobs all directions ? ?07/05/2021: ?Therapeutic Exercise: ?Recumbent bike partial revolutions x 5 min to improve knee flexion, while taking subjective ?Seated knee flexion/extension AROM throughout seated manual ?Prone hamstring curl with 10# x 10 ?SL leg press (cybex seat 7) 60# 2 x 10 ?Standing lunge knee flexion stretch on step 5 x 10 sec ?Knee extension machine SL 20# x 10 ?Manual Therapy: ?Seated tib-fem mobs to improve knee flexion and extension ?Seated and supine knee PROM for flexion and extension ?Contract relax knee flexion stretching in seated ?Supine patellofemoral mobs all directions ?  ?PATIENT EDUCATION:  ?Education details: HEP ?Person educated: Patient ?Education method: Explanation, Demonstration, Tactile cues, Verbal cues ?Education comprehension: verbalized understanding, returned demonstration, verbal cues required, tactile cues required, and needs further education ?  ?HOME EXERCISE PROGRAM: ?Access Code: [email or web address] ?  ?  ?ASSESSMENT: ?CLINICAL IMPRESSION: ?Patient tolerated therapy well with no adverse effects. He demonstrates improve knee flexion this visit and continues to progress well with his strengthening exercises and did demonstrate improve quad activation and patellar mobility.. Overall his knee mobility continues to improve and his pain level with stretching is getting better. He does continue to have pain transitioning from knee flexion to extension. No changes to HEP. Patient would benefit from continued skilled PT to progress mobility and strength in order to reduce pain and maximize functional ability. ? ?Objective impairments include Abnormal gait, decreased activity tolerance, decreased balance, difficulty walking, decreased ROM, decreased strength, and pain. ?  ?   ?GOALS: ?Goals reviewed with patient? Yes ?  ?SHORT TERM GOALS: ?  ?STG Name Target Date Goal status  ?1 Patient will be I with initial HEP in order to progress with therapy. ?Baseline: independent with initial HEP 05/28/2021 MET  ?2 PT will review FOTO with patient by 3rd visit in order to understand expected progress and outcome with therapy. ?Baseline: reviewed with patient 05/28/2021 MET  ?3 Patient will demonstrate active knee motion 5-90 deg in order to improve gait ?Baseline: 8-75 deg ?06/21/2021: 6-85 deg 07/18/2021 ONGOING  ?4 Patient will ambulate community level without AD to improve access and return to work without limitations ?Baseline: patient ambulating without assistive device 05/28/2021 MET  ?  ?LONG TERM GOALS:  ?  ?LTG Name Target Date Goal status  ?1 Patient will be I with final HEP to maintain progress from PT. ?Baseline: provided at eval ?06/21/2021: progressing with HEP 08/16/2021 ONGOING  ?2 Patient will report >/= 66% status on FOTO  to indicate improved functional ability. ?Baseline: 53% ?06/21/2021: 56% 08/16/2021 ONGOING  ?3 Patient will demonstrate active knee motion 2-100 deg to all normal movement mechanics with transfers and stairs ?Baseline: 15-60 deg at eval ?06/21/2021: 6-85 deg 08/16/2021 MODIFIED  ?4 Patient will exhibit knee strength 5/5 MMT to improve standing tolerance reduce pain with activity ?Baseline: grossly 4-/5 in available range, extension lag with SLR ?06/21/2021: grossly 4/5 MMT 08/16/2021 ONGOING  ?5 Patient will report pain </= 2/10 with all standing and walking activity to maximize functional ability and allow full return to work ?Baseline: 7/10 ?06/21/2021: 4/10  08/16/2021 ONGOING  ?  ?  ?PLAN: ?PT FREQUENCY: 1-2x/week ?  ?PT DURATION: 8 weeks ?  ?PLANNED INTERVENTIONS: Therapeutic exercises, Therapeutic activity, Neuro Muscular re-education, Balance training, Gait training, Patient/Family education, Joint mobilization, Stair training, Aquatic Therapy, Dry Needling, Cryotherapy, Moist  heat, Taping, Vasopneumatic device, and Manual therapy ?  ?PLAN FOR NEXT SESSION: Review HEP and progress PRN, manual/stretching for knee flexion and extension, progress strength to closed chain as tolerated, va

## 2021-07-19 ENCOUNTER — Ambulatory Visit: Payer: PRIVATE HEALTH INSURANCE | Attending: Physician Assistant | Admitting: Physical Therapy

## 2021-07-19 ENCOUNTER — Encounter: Payer: Self-pay | Admitting: Physical Therapy

## 2021-07-19 ENCOUNTER — Other Ambulatory Visit: Payer: Self-pay

## 2021-07-19 DIAGNOSIS — M25562 Pain in left knee: Secondary | ICD-10-CM | POA: Diagnosis present

## 2021-07-19 DIAGNOSIS — M6281 Muscle weakness (generalized): Secondary | ICD-10-CM | POA: Insufficient documentation

## 2021-07-19 DIAGNOSIS — R600 Localized edema: Secondary | ICD-10-CM | POA: Diagnosis present

## 2021-07-19 DIAGNOSIS — R2689 Other abnormalities of gait and mobility: Secondary | ICD-10-CM | POA: Insufficient documentation

## 2021-07-19 DIAGNOSIS — G8929 Other chronic pain: Secondary | ICD-10-CM | POA: Diagnosis present

## 2021-07-30 NOTE — Therapy (Signed)
?OUTPATIENT PHYSICAL THERAPY TREATMENT NOTE ? ? ?Patient Name: Carlos Aguirre ?MRN: [id number] ?DOB:[birth year], 51 y.o., male ?Today's Date: 08/02/2021 ? ?PCP: Pcp, No ?REFERRING PROVIDER: Aundra Dubin, PA-C ? ? PT End of Session - 08/02/21 1004   ? ? Visit Number 24   ? Number of Visits 27   ? Date for PT Re-Evaluation 08/16/21   ? Authorization Type [employer]   ? PT Start Time 1003   ? PT Stop Time 1045   ? PT Time Calculation (min) 42 min   ? Activity Tolerance Patient tolerated treatment well   ? Behavior During Therapy [facility] for tasks assessed/performed   ? ?  ?  ? ?  ? ? ? ? ? ? ? ? ? ? ? ? ? ? ? ? ? ? ? ? ? ? ? ?Past Medical History:  ?Diagnosis Date  ? Complication of anesthesia   ? Hard to wake up per patient  ? ?Past Surgical History:  ?Procedure Laterality Date  ? HERNIA REPAIR    ? KNEE SURGERY    ? SHOULDER SURGERY    ? TOTAL KNEE ARTHROPLASTY Left 04/02/2021  ? Procedure: LEFT TOTAL KNEE ARTHROPLASTY;  Surgeon: Leandrew Koyanagi, MD;  Location: [place];  Service: Orthopedics;  Laterality: Left;  ? ?Patient Active Problem List  ? Diagnosis Date Noted  ? Arthrofibrosis of knee joint, left 05/16/2021  ? Status post total knee replacement, left 04/02/2021  ? Primary osteoarthritis of left knee 02/02/2021  ? ? ?REFERRING DIAG: Hx of total knee replacement, left ? ?THERAPY DIAG:  ?Chronic pain of left knee ? ?Muscle weakness (generalized) ? ?Other abnormalities of gait and mobility ? ?Localized edema ? ?PERTINENT HISTORY: None ? ?PRECAUTIONS: None ? ? ?SUBJECTIVE: Patient reports he feels he is bending the knee more. He does state driving one of his cars is uncomfortable because he knee remains bent. He also notes that he did a lot of walking recently and this did cause some knee discomfort.  ? ?PAIN:  ?Are you having pain? Yes ?NPRS scale: 2/10 ?Pain location: Knee ?Pain orientation: Left  ?PAIN TYPE: Surgical ?Pain description: Constant, stiff, sore ?Aggravating factors: Moving the knee ?Relieving factors:  Cold pack, rest, elevation ? ?PATIENT GOALS Pain relief and get back to work without limitation ?  ?  ?OBJECTIVE: ?PATIENT SURVEYS:  ?FOTO 56% functional status - 06/21/2021 (53% at evaluation) ?  ?  EDEMA ?  Right: 42 cm, Left: 42.5 cm ? ?Patient demonstrates slight increase edema lateral aspect of knee ? ?LE AROM/PROM: ?  ?AROM Left ?07/09/2021 Left ?07/19/2021 Left ?08/02/2021  ?Knee flexion 85 85 90  ?Knee extension -6  -6  ?  ?PROM Left ?05/30/2021 Left ?07/19/2021  ?Knee flexion 88 90  ?Knee extension         -6   ?  ?LE MMT: ?  ?MMT Right ?04/30/2021 Left ?04/30/2021 Left ?06/21/2021 Left ?06/28/2021  ?Hip flexion 4 4    ?Hip extension 4- 4-    ?Hip abduction 4- 4-    ?Knee flexion 5 4- 4 4  ?Knee extension 5 4- 4 4  ?  ?Patient able to perform SLR without extension lag this visit (05/15/2021) ? ? ?TODAY'S TREATMENT ?08/02/2021: ?Therapeutic Exercise: ?Recumbent bike partial revolutions x 5 min to improve knee flexion, while taking subjective ?Seated knee flexion/extension AROM throughout seated manual ?Standing TKE with green x 20 ?SL leg press (cybex) seat 7 60# x 10, seat 6 60# x 10 and 70#  x 10 ?Forward step-up 8" x 10 ?Knee extension machine SL 20# x 10, 25# x 10 ?Manual Therapy: ?Seated tib-fem mobs to improve knee flexion and extension ?Seated and supine knee PROM for flexion and extension ?Supine patellofemoral mobs all directions ?Modified thomas stretch with passive knee flexion 2 x 30 sec ? ? ?07/19/2021: ?Therapeutic Exercise: ?Recumbent bike partial revolutions x 5 min to improve knee flexion, while taking subjective ?Seated knee flexion/extension AROM throughout seated manual ?Standing TKE with green x 20 ?Standing lunge knee flexion stretch on step 5 x 10 sec ?SL leg press (cybex) seat 7 60# x 10, seat 6 60# x 10 ?Forward step-up 8" x 10 ?Knee extension machine SL 20# 2 x 10 ?Manual Therapy: ?Seated tib-fem mobs to improve knee flexion and extension ?Seated and supine knee PROM for flexion and  extension ?Contract relax knee flexion stretching in seated ?Supine patellofemoral mobs all directions ?Modified thomas stretch with passive knee flexion 2 x 30 sec ? ?07/09/2021: ?Therapeutic Exercise: ?Recumbent bike partial revolutions x 5 min to improve knee flexion, while taking subjective ?Seated knee flexion/extension AROM throughout seated manual ?Standing lunge knee flexion stretch on step 5 x 10 sec ?SL leg press (cybex seat 7) 60# 2 x 10 ?Forward step-up 8" 2 x 10 ?Knee extension machine SL 20# 2 x 10 ?Manual Therapy: ?Seated tib-fem mobs to improve knee flexion and extension ?Seated and supine knee PROM for flexion and extension ?Contract relax knee flexion stretching in seated ?Supine patellofemoral mobs all directions ?  ?PATIENT EDUCATION:  ?Education details: HEP ?Person educated: Patient ?Education method: Explanation, Demonstration, Tactile cues, Verbal cues ?Education comprehension: verbalized understanding, returned demonstration, verbal cues required, tactile cues required, and needs further education ?  ?HOME EXERCISE PROGRAM: ?Access Code: [email or web address] ?  ?  ?ASSESSMENT: ?CLINICAL IMPRESSION: ?Patient tolerated therapy well with no adverse effects. He demonstrates improved knee flexion this visit and is progress well with his strengthening. He also demonstrates improve step negotiation with his improved knee flexion and seems to be progressing well with activity tolerance. No changes to HEP this visit. Patient would benefit from continued skilled PT to progress mobility and strength in order to reduce pain and maximize functional ability. ? ?Objective impairments include Abnormal gait, decreased activity tolerance, decreased balance, difficulty walking, decreased ROM, decreased strength, and pain. ?  ?  ?GOALS: ?Goals reviewed with patient? Yes ?  ?SHORT TERM GOALS: ?  ?STG Name Target Date Goal status  ?1 Patient will be I with initial HEP in order to progress with therapy. ?Baseline: independent  with initial HEP 05/28/2021 MET  ?2 PT will review FOTO with patient by 3rd visit in order to understand expected progress and outcome with therapy. ?Baseline: reviewed with patient 05/28/2021 MET  ?3 Patient will demonstrate active knee motion 5-90 deg in order to improve gait ?Baseline: 8-75 deg ?06/21/2021: 6-85 deg 07/18/2021 ONGOING  ?4 Patient will ambulate community level without AD to improve access and return to work without limitations ?Baseline: patient ambulating without assistive device 05/28/2021 MET  ?  ?LONG TERM GOALS:  ?  ?LTG Name Target Date Goal status  ?1 Patient will be I with final HEP to maintain progress from PT. ?Baseline: provided at eval ?06/21/2021: progressing with HEP 08/16/2021 ONGOING  ?2 Patient will report >/= 66% status on FOTO to indicate improved functional ability. ?Baseline: 53% ?06/21/2021: 56% 08/16/2021 ONGOING  ?3 Patient will demonstrate active knee motion 2-100 deg to all normal movement mechanics with transfers and stairs ?Baseline: 15-60  deg at eval ?06/21/2021: 6-85 deg 08/16/2021 MODIFIED  ?4 Patient will exhibit knee strength 5/5 MMT to improve standing tolerance reduce pain with activity ?Baseline: grossly 4-/5 in available range, extension lag with SLR ?06/21/2021: grossly 4/5 MMT 08/16/2021 ONGOING  ?5 Patient will report pain </= 2/10 with all standing and walking activity to maximize functional ability and allow full return to work ?Baseline: 7/10 ?06/21/2021: 4/10  08/16/2021 ONGOING  ?  ?  ?PLAN: ?PT FREQUENCY: 1-2x/week ?  ?PT DURATION: 8 weeks ?  ?PLANNED INTERVENTIONS: Therapeutic exercises, Therapeutic activity, Neuro Muscular re-education, Balance training, Gait training, Patient/Family education, Joint mobilization, Stair training, Aquatic Therapy, Dry Needling, Cryotherapy, Moist heat, Taping, Vasopneumatic device, and Manual therapy ?  ?PLAN FOR NEXT SESSION: Review HEP and progress PRN, manual/stretching for knee flexion and extension, progress strength to closed chain as  tolerated, vaso post session, balance and gait training. ?  ? ? ?Hilda Blades, PT, DPT, LAT, ATC ?08/02/21  10:46 AM ?Phone: [phone] ?Fax: [phone] ? ? ? ? ?   ?

## 2021-08-02 ENCOUNTER — Encounter: Payer: Self-pay | Admitting: Physical Therapy

## 2021-08-02 ENCOUNTER — Other Ambulatory Visit: Payer: Self-pay

## 2021-08-02 ENCOUNTER — Ambulatory Visit: Payer: PRIVATE HEALTH INSURANCE | Admitting: Physical Therapy

## 2021-08-02 DIAGNOSIS — M25562 Pain in left knee: Secondary | ICD-10-CM | POA: Diagnosis not present

## 2021-08-02 DIAGNOSIS — G8929 Other chronic pain: Secondary | ICD-10-CM

## 2021-08-02 DIAGNOSIS — R2689 Other abnormalities of gait and mobility: Secondary | ICD-10-CM

## 2021-08-02 DIAGNOSIS — R600 Localized edema: Secondary | ICD-10-CM

## 2021-08-02 DIAGNOSIS — M6281 Muscle weakness (generalized): Secondary | ICD-10-CM

## 2021-08-07 NOTE — Therapy (Incomplete)
?OUTPATIENT PHYSICAL THERAPY TREATMENT NOTE ? ? ?Patient Name: Carlos Aguirre ?MRN: [id number] ?DOB:[birth year], 51 y.o., male ?Today's Date: 08/07/2021 ? ?PCP: Pcp, No ?REFERRING PROVIDER: Aundra Dubin, PA-C ? ? ? ? ? ? ? ? ? ? ? ? ? ? ? ? ? ? ? ? ? ? ? ? ? ?Past Medical History:  ?Diagnosis Date  ? Complication of anesthesia   ? Hard to wake up per patient  ? ?Past Surgical History:  ?Procedure Laterality Date  ? HERNIA REPAIR    ? KNEE SURGERY    ? SHOULDER SURGERY    ? TOTAL KNEE ARTHROPLASTY Left 04/02/2021  ? Procedure: LEFT TOTAL KNEE ARTHROPLASTY;  Surgeon: Leandrew Koyanagi, MD;  Location: [place];  Service: Orthopedics;  Laterality: Left;  ? ?Patient Active Problem List  ? Diagnosis Date Noted  ? Arthrofibrosis of knee joint, left 05/16/2021  ? Status post total knee replacement, left 04/02/2021  ? Primary osteoarthritis of left knee 02/02/2021  ? ? ?REFERRING DIAG: Hx of total knee replacement, left ? ?THERAPY DIAG:  ?No diagnosis found. ? ?PERTINENT HISTORY: None ? ?PRECAUTIONS: None ? ? ?SUBJECTIVE: Patient reports he feels he is bending the knee more. He does state driving one of his cars is uncomfortable because he knee remains bent. He also notes that he did a lot of walking recently and this did cause some knee discomfort.  ? ?PAIN:  ?Are you having pain? Yes ?NPRS scale: 2/10 ?Pain location: Knee ?Pain orientation: Left  ?PAIN TYPE: Surgical ?Pain description: Constant, stiff, sore ?Aggravating factors: Moving the knee ?Relieving factors: Cold pack, rest, elevation ? ?PATIENT GOALS Pain relief and get back to work without limitation ?  ?  ?OBJECTIVE: ?PATIENT SURVEYS:  ?FOTO 56% functional status - 06/21/2021 (53% at evaluation) ?  ?  EDEMA ?  Right: 42 cm, Left: 42.5 cm ? ?Patient demonstrates slight increase edema lateral aspect of knee ? ?LE AROM/PROM: ?  ?AROM Left ?07/09/2021 Left ?07/19/2021 Left ?08/02/2021  ?Knee flexion 85 85 90  ?Knee extension -6  -6  ?  ?PROM Left ?05/30/2021 Left ?07/19/2021  ?Knee  flexion 88 90  ?Knee extension         -6   ?  ?LE MMT: ?  ?MMT Right ?04/30/2021 Left ?04/30/2021 Left ?06/21/2021 Left ?06/28/2021  ?Hip flexion 4 4    ?Hip extension 4- 4-    ?Hip abduction 4- 4-    ?Knee flexion 5 4- 4 4  ?Knee extension 5 4- 4 4  ?  ?Patient able to perform SLR without extension lag this visit (05/15/2021) ? ? ?TODAY'S TREATMENT ?08/09/2021: ?Therapeutic Exercise: ?Recumbent bike partial revolutions x 5 min to improve knee flexion, while taking subjective ?Seated knee flexion/extension AROM throughout seated manual ?Standing TKE with green x 20 ?SL leg press (cybex) seat 7 60# x 10, seat 6 60# x 10 and 70# x 10 ?Forward step-up 8" x 10 ?Knee extension machine SL 20# x 10, 25# x 10 ?Manual Therapy: ?Seated tib-fem mobs to improve knee flexion and extension ?Seated and supine knee PROM for flexion and extension ?Supine patellofemoral mobs all directions ?Modified thomas stretch with passive knee flexion 2 x 30 sec ? ? ?08/02/2021: ?Therapeutic Exercise: ?Recumbent bike partial revolutions x 5 min to improve knee flexion, while taking subjective ?Seated knee flexion/extension AROM throughout seated manual ?Standing TKE with green x 20 ?SL leg press (cybex) seat 7 60# x 10, seat 6 60# x 10 and 70# x 10 ?Forward  step-up 8" x 10 ?Knee extension machine SL 20# x 10, 25# x 10 ?Manual Therapy: ?Seated tib-fem mobs to improve knee flexion and extension ?Seated and supine knee PROM for flexion and extension ?Supine patellofemoral mobs all directions ?Modified thomas stretch with passive knee flexion 2 x 30 sec ? ?07/19/2021: ?Therapeutic Exercise: ?Recumbent bike partial revolutions x 5 min to improve knee flexion, while taking subjective ?Seated knee flexion/extension AROM throughout seated manual ?Standing TKE with green x 20 ?Standing lunge knee flexion stretch on step 5 x 10 sec ?SL leg press (cybex) seat 7 60# x 10, seat 6 60# x 10 ?Forward step-up 8" x 10 ?Knee extension machine SL 20# 2 x 10 ?Manual  Therapy: ?Seated tib-fem mobs to improve knee flexion and extension ?Seated and supine knee PROM for flexion and extension ?Contract relax knee flexion stretching in seated ?Supine patellofemoral mobs all directions ?Modified thomas stretch with passive knee flexion 2 x 30 sec ?  ?PATIENT EDUCATION:  ?Education details: HEP ?Person educated: Patient ?Education method: Explanation, Demonstration, Tactile cues, Verbal cues ?Education comprehension: verbalized understanding, returned demonstration, verbal cues required, tactile cues required, and needs further education ?  ?HOME EXERCISE PROGRAM: ?Access Code: [email or web address] ?  ?  ?ASSESSMENT: ?CLINICAL IMPRESSION: ?Patient tolerated therapy well with no adverse effects. *** Patient would benefit from continued skilled PT to progress mobility and strength in order to reduce pain and maximize functional ability. ? ?He demonstrates improved knee flexion this visit and is progress well with his strengthening. He also demonstrates improve step negotiation with his improved knee flexion and seems to be progressing well with activity tolerance. No changes to HEP this visit.  ? ?Objective impairments include Abnormal gait, decreased activity tolerance, decreased balance, difficulty walking, decreased ROM, decreased strength, and pain. ?  ?  ?GOALS: ?Goals reviewed with patient? Yes ?  ?SHORT TERM GOALS: ?  ?STG Name Target Date Goal status  ?1 Patient will be I with initial HEP in order to progress with therapy. ?Baseline: independent with initial HEP 05/28/2021 MET  ?2 PT will review FOTO with patient by 3rd visit in order to understand expected progress and outcome with therapy. ?Baseline: reviewed with patient 05/28/2021 MET  ?3 Patient will demonstrate active knee motion 5-90 deg in order to improve gait ?Baseline: 8-75 deg ?06/21/2021: 6-85 deg 07/18/2021 ONGOING  ?4 Patient will ambulate community level without AD to improve access and return to work without limitations ?Baseline:  patient ambulating without assistive device 05/28/2021 MET  ?  ?LONG TERM GOALS:  ?  ?LTG Name Target Date Goal status  ?1 Patient will be I with final HEP to maintain progress from PT. ?Baseline: provided at eval ?06/21/2021: progressing with HEP 08/16/2021 ONGOING  ?2 Patient will report >/= 66% status on FOTO to indicate improved functional ability. ?Baseline: 53% ?06/21/2021: 56% 08/16/2021 ONGOING  ?3 Patient will demonstrate active knee motion 2-100 deg to all normal movement mechanics with transfers and stairs ?Baseline: 15-60 deg at eval ?06/21/2021: 6-85 deg 08/16/2021 MODIFIED  ?4 Patient will exhibit knee strength 5/5 MMT to improve standing tolerance reduce pain with activity ?Baseline: grossly 4-/5 in available range, extension lag with SLR ?06/21/2021: grossly 4/5 MMT 08/16/2021 ONGOING  ?5 Patient will report pain </= 2/10 with all standing and walking activity to maximize functional ability and allow full return to work ?Baseline: 7/10 ?06/21/2021: 4/10  08/16/2021 ONGOING  ?  ?  ?PLAN: ?PT FREQUENCY: 1-2x/week ?  ?PT DURATION: 8 weeks ?  ?PLANNED INTERVENTIONS: Therapeutic exercises,  Therapeutic activity, Neuro Muscular re-education, Balance training, Gait training, Patient/Family education, Joint mobilization, Stair training, Aquatic Therapy, Dry Needling, Cryotherapy, Moist heat, Taping, Vasopneumatic device, and Manual therapy ?  ?PLAN FOR NEXT SESSION: Review HEP and progress PRN, manual/stretching for knee flexion and extension, progress strength to closed chain as tolerated, vaso post session, balance and gait training. ?  ? ? ?Hilda Blades, PT, DPT, LAT, ATC ?08/07/21  9:42 AM ?Phone: [phone] ?Fax: [phone] ? ? ? ? ?   ?

## 2021-08-09 ENCOUNTER — Ambulatory Visit: Payer: PRIVATE HEALTH INSURANCE | Admitting: Physical Therapy

## 2021-08-09 ENCOUNTER — Ambulatory Visit (INDEPENDENT_AMBULATORY_CARE_PROVIDER_SITE_OTHER): Payer: PRIVATE HEALTH INSURANCE | Admitting: Orthopaedic Surgery

## 2021-08-09 ENCOUNTER — Encounter: Payer: Self-pay | Admitting: Orthopaedic Surgery

## 2021-08-09 DIAGNOSIS — Z9889 Other specified postprocedural states: Secondary | ICD-10-CM

## 2021-08-09 DIAGNOSIS — Z96652 Presence of left artificial knee joint: Secondary | ICD-10-CM

## 2021-08-09 NOTE — Progress Notes (Signed)
? ?  Post-Op Visit Note ?  ?Patient: Carlos Aguirre           ?Date of Birth: 02/14/1971           ?MRN: [id number] ?Visit Date: 08/09/2021 ?PCP: Pcp, No ? ? ?Assessment & Plan: ? ?Chief Complaint:  ?Chief Complaint  ?Patient presents with  ? Left Knee - Pain  ? ?Visit Diagnoses:  ?1. History of total left knee replacement   ?2. S/P surgical manipulation of knee joint   ? ? ?Plan: Patient comes in approximately 2-1/2 months status post left knee manipulation and approximately 4 months status post left total knee replacement.  He has been in physical therapy making slow but steady progress.  His pain is improved, but continues to complain of pain in the medial joint line.  This is really only aggravated with applying pressure to the area.  He does note continued stiffness/tightness as well.  He is working not only in formal physical therapy but on a home exercise program.  Examination left knee reveals a fully healed surgical scar without complication.  Range of motion 5 to 90 degrees.  He is stable to valgus varus stress.  Does have point tenderness to medial joint line.  He is neurovascularly intact distally.  At this point, he will continue working on his home exercise program as he has his last formal physical therapy visit today.  He does appear to be happy with his progress but thinks he may be able to progress a little more with range of motion.  Dental prophylaxis reinforced.  Follow-up with [place] in 2 months time for repeat evaluation and 2 view x-rays of the left knee.  Call with concerns or questions. ? ?Follow-Up Instructions: Return in about 2 months (around 10/09/2021).  ? ?Orders:  ?No orders of the defined types were placed in this encounter. ? ?No orders of the defined types were placed in this encounter. ? ? ?Imaging: ?No new imaging ? ?PMFS History: ?Patient Active Problem List  ? Diagnosis Date Noted  ? Arthrofibrosis of knee joint, left 05/16/2021  ? Status post total knee replacement, left 04/02/2021   ? Primary osteoarthritis of left knee 02/02/2021  ? ?Past Medical History:  ?Diagnosis Date  ? Complication of anesthesia   ? Hard to wake up per patient  ?  ?History reviewed. No pertinent family history.  ?Past Surgical History:  ?Procedure Laterality Date  ? HERNIA REPAIR    ? KNEE SURGERY    ? SHOULDER SURGERY    ? TOTAL KNEE ARTHROPLASTY Left 04/02/2021  ? Procedure: LEFT TOTAL KNEE ARTHROPLASTY;  Surgeon: Tarry Kos, MD;  Location: [facility];  Service: Orthopedics;  Laterality: Left;  ? ?Social History  ? ?Occupational History  ? Not on file  ?Tobacco Use  ? Smoking status: Never  ? Smokeless tobacco: Not on file  ?Vaping Use  ? Vaping Use: Never used  ?Substance and Sexual Activity  ? Alcohol use: No  ? Drug use: No  ? Sexual activity: Not on file  ? ? ? ?

## 2021-08-14 NOTE — Therapy (Signed)
?OUTPATIENT PHYSICAL THERAPY TREATMENT NOTE ? ?DISCHARGE ? ? ?Patient Name: Carlos Aguirre ?MRN: [id number] ?DOB:[birth year], 51 y.o., male ?Today's Date: 08/16/2021 ? ?PCP: Pcp, No ?REFERRING PROVIDER: Aundra Dubin, PA-C ? ? ? PT End of Session - 08/16/21 1000   ? ? Visit Number 25   ? Number of Visits 27   ? Date for PT Re-Evaluation 08/16/21   ? Authorization Type [employer]   ? Progress Note Due on Visit 10   ? PT Start Time 1000   ? PT Stop Time 1045   ? PT Time Calculation (min) 45 min   ? Activity Tolerance Patient tolerated treatment well   ? Behavior During Therapy [facility] for tasks assessed/performed   ? ?  ?  ? ?  ? ? ? ? ? ? ? ? ? ? ? ? ? ? ? ? ? ? ? ? ? ? ? ? ?Past Medical History:  ?Diagnosis Date  ? Complication of anesthesia   ? Hard to wake up per patient  ? ?Past Surgical History:  ?Procedure Laterality Date  ? HERNIA REPAIR    ? KNEE SURGERY    ? SHOULDER SURGERY    ? TOTAL KNEE ARTHROPLASTY Left 04/02/2021  ? Procedure: LEFT TOTAL KNEE ARTHROPLASTY;  Surgeon: Leandrew Koyanagi, MD;  Location: [facility];  Service: Orthopedics;  Laterality: Left;  ? ?Patient Active Problem List  ? Diagnosis Date Noted  ? Arthrofibrosis of knee joint, left 05/16/2021  ? Status post total knee replacement, left 04/02/2021  ? Primary osteoarthritis of left knee 02/02/2021  ? ? ?REFERRING DIAG: Hx of total knee replacement, left ? ?THERAPY DIAG:  ?Chronic pain of left knee ? ?Muscle weakness (generalized) ? ?Other abnormalities of gait and mobility ? ?Localized edema ? ?PERTINENT HISTORY: None ? ?PRECAUTIONS: None ? ? ?SUBJECTIVE: Patient reports he followed up with surgeon last week and was told to just keeping working on it. He is feeling tight this week but feels like the weather has contributed to his dull ache. Patient reports he remains consistent with his exercises and feels comfortable being independent with his HEP to continue progression of motion and strength at home. ? ?PAIN:  ?Are you having pain? Yes ?NPRS  scale: 2/10 ?Pain location: Knee ?Pain orientation: Left  ?PAIN TYPE: Surgical ?Pain description: Constant, tight, dull ache ?Aggravating factors: Moving the knee ?Relieving factors: Cold pack, rest, elevation ? ?PATIENT GOALS Pain relief and get back to work without limitation ?  ?  ?OBJECTIVE: ?PATIENT SURVEYS:  ?FOTO 64% functional status - 08/16/2021 ?56% functional status - 06/21/2021 ?53% at evaluation ?  ?  EDEMA ?  Right: 42 cm, Left: 42.5 cm ? ?Patient demonstrates slight increase edema lateral aspect of knee ? ?LE AROM/PROM: ?  ?AROM Left ?07/09/2021 Left ?07/19/2021 Left ?08/02/2021 Left ?08/16/2021  ?Knee flexion 85 85 90 91  ?Knee extension -6  -6 -5  ?  ?PROM Left ?05/30/2021 Left ?07/19/2021 Left ?08/16/2021  ?Knee flexion 88 90 93  ?Knee extension         -6    ?  ?LE MMT: ?  ?MMT Right ?04/30/2021 Left ?04/30/2021 Left ?06/21/2021 Left ?06/28/2021 Left ?08/16/2021  ?Hip flexion 4 4     ?Hip extension 4- 4-   4  ?Hip abduction 4- 4-   4  ?Knee flexion 5 4- 4 4 4+  ?Knee extension 5 4- 4 4 4+  ?  ? ?TODAY'S TREATMENT ?08/16/2021: ?Therapeutic Exercise: ?Recumbent bike partial revolutions x  5 min to improve knee flexion, while taking subjective ?Seated knee flexion/extension AROM throughout seated manual ?Slant board calf stretch 2 x 30 sec ?SLR x 10 ?Sidelying hip abduction 2 x 10 ?Standing TKE with green x 20 ?SL leg press (cybex) seat 7 60# x 10, 80# x 10 ?Forward step-up 8" x 10 ?Heel raises x 20 ?Knee extension machine SL 25# 2 x 10 ?Knee flexion machine 45# 2 x 10 ?Manual Therapy: ?Seated tib-fem mobs to improve knee flexion and extension ?Seated and supine knee PROM for flexion and extension ?Modified thomas stretch with passive knee flexion 2 x 30 sec ? ? ?08/02/2021: ?Therapeutic Exercise: ?Recumbent bike partial revolutions x 5 min to improve knee flexion, while taking subjective ?Seated knee flexion/extension AROM throughout seated manual ?Standing TKE with green x 20 ?SL leg press (cybex) seat 7 60# x 10, seat 6  60# x 10 and 70# x 10 ?Forward step-up 8" x 10 ?Knee extension machine SL 20# x 10, 25# x 10 ?Manual Therapy: ?Seated tib-fem mobs to improve knee flexion and extension ?Seated and supine knee PROM for flexion and extension ?Supine patellofemoral mobs all directions ?Modified thomas stretch with passive knee flexion 2 x 30 sec ? ?07/19/2021: ?Therapeutic Exercise: ?Recumbent bike partial revolutions x 5 min to improve knee flexion, while taking subjective ?Seated knee flexion/extension AROM throughout seated manual ?Standing TKE with green x 20 ?Standing lunge knee flexion stretch on step 5 x 10 sec ?SL leg press (cybex) seat 7 60# x 10, seat 6 60# x 10 ?Forward step-up 8" x 10 ?Knee extension machine SL 20# 2 x 10 ?Manual Therapy: ?Seated tib-fem mobs to improve knee flexion and extension ?Seated and supine knee PROM for flexion and extension ?Contract relax knee flexion stretching in seated ?Supine patellofemoral mobs all directions ?Modified thomas stretch with passive knee flexion 2 x 30 sec ?  ?PATIENT EDUCATION:  ?Education details: POC discharge, FOTO and progress toward goals, HEP ?Person educated: Patient ?Education method: Explanation ?Education comprehension: Verbalized understanding ?  ?HOME EXERCISE PROGRAM: ?Access Code: [email or web address] ?  ?  ?ASSESSMENT: ?CLINICAL IMPRESSION: ?Patient tolerated therapy well with no adverse effects. He has made great progress in therapy demonstrating improved knee motion, strength, and reporting improved functional ability. He does continue to exhibit gross limitation in his motion and strength but is independent in his HEP to continue progression and is pleased with his current functional ability. Patient will be formally discharged from PT. ? ?Objective impairments include Abnormal gait, decreased activity tolerance, decreased balance, difficulty walking, decreased ROM, decreased strength, and pain. ?  ?  ?GOALS: ?Goals reviewed with patient? Yes ?  ?SHORT TERM GOALS: ?  ?STG  Name Target Date Goal status  ?1 Patient will be I with initial HEP in order to progress with therapy. ?Baseline: independent with initial HEP 05/28/2021 MET  ?2 PT will review FOTO with patient by 3rd visit in order to understand expected progress and outcome with therapy. ?Baseline: reviewed with patient 05/28/2021 MET  ?3 Patient will demonstrate active knee motion 5-90 deg in order to improve gait ?Baseline: 8-75 deg ?06/21/2021: 6-85 deg ?08/16/2021: 5-91 deg 07/18/2021 MET  ?4 Patient will ambulate community level without AD to improve access and return to work without limitations ?Baseline: patient ambulating without assistive device 05/28/2021 MET  ?  ?LONG TERM GOALS:  ?  ?LTG Name Target Date Goal status  ?1 Patient will be I with final HEP to maintain progress from PT. ?Baseline: provided at eval ?06/21/2021: progressing  with HEP ?08/16/2021: independent 08/16/2021 MET  ?2 Patient will report >/= 66% status on FOTO to indicate improved functional ability. ?Baseline: 53% ?06/21/2021: 56% ?08/16/2021: 64% 08/16/2021 PARTIALLY MET  ?3 Patient will demonstrate active knee motion 2-100 deg to all normal movement mechanics with transfers and stairs ?Baseline: 15-60 deg at eval ?06/21/2021: 6-85 deg ?08/16/2021: 5-91 deg 08/16/2021 PARTIALLY MET  ?4 Patient will exhibit knee strength 5/5 MMT to improve standing tolerance reduce pain with activity ?Baseline: grossly 4-/5 in available range, extension lag with SLR ?06/21/2021: grossly 4/5 MMT ?08/16/2021: grossly 4+/5 MMT 08/16/2021 PARTIALLY MET  ?5 Patient will report pain </= 2/10 with all standing and walking activity to maximize functional ability and allow full return to work ?Baseline: 7/10 ?06/21/2021: 4/10  ?08/16/2021: 2/10 08/16/2021 MET  ?  ?  ?PLAN: ?PT FREQUENCY: 1-2x/week ?  ?PT DURATION: 8 weeks ?  ?PLANNED INTERVENTIONS: Therapeutic exercises, Therapeutic activity, Neuro Muscular re-education, Balance training, Gait training, Patient/Family education, Joint mobilization, Stair  training, Aquatic Therapy, Dry Needling, Cryotherapy, Moist heat, Taping, Vasopneumatic device, and Manual therapy ?  ?PLAN FOR NEXT SESSION: NA - discharge ?  ? ? ?Hilda Blades, PT, DPT, LAT, ATC ?08/16/21  10:4

## 2021-08-16 ENCOUNTER — Encounter: Payer: Self-pay | Admitting: Physical Therapy

## 2021-08-16 ENCOUNTER — Other Ambulatory Visit: Payer: Self-pay

## 2021-08-16 ENCOUNTER — Ambulatory Visit: Payer: PRIVATE HEALTH INSURANCE | Attending: Physician Assistant | Admitting: Physical Therapy

## 2021-08-16 DIAGNOSIS — M25562 Pain in left knee: Secondary | ICD-10-CM | POA: Insufficient documentation

## 2021-08-16 DIAGNOSIS — R2689 Other abnormalities of gait and mobility: Secondary | ICD-10-CM | POA: Insufficient documentation

## 2021-08-16 DIAGNOSIS — G8929 Other chronic pain: Secondary | ICD-10-CM | POA: Diagnosis present

## 2021-08-16 DIAGNOSIS — R600 Localized edema: Secondary | ICD-10-CM | POA: Diagnosis present

## 2021-08-16 DIAGNOSIS — M6281 Muscle weakness (generalized): Secondary | ICD-10-CM | POA: Insufficient documentation

## 2021-08-16 NOTE — Patient Instructions (Signed)
Access Code: [email or web address] ?URL: [email or web address] ?Date: 08/16/2021 ?Prepared by: Rosana Hoes ? ?Exercises ?- Active Straight Leg Raise with Quad Set  - 2-3 x daily - 7 x weekly - 2 sets - 5 reps ?- Seated Knee Flexion AAROM  - 1 x daily - 7 x weekly - 10 reps - 10 seconds hold ?- Seated Long Arc Quad  - 2-3 x daily - 7 x weekly - 2 sets - 5 reps - 5 seconds hold ?- Seated Hamstring Stretch  - 2-3 x daily - 7 x weekly - 3 reps - 30 seconds hold ?- Standing Gastroc Stretch at [employer]  - 2-3 x daily - 7 x weekly - 3 reps - 30 seconds hold ?- Seated Table Hamstring Stretch  - 1 x daily - 7 x weekly - 3 sets - 60 reps ?- Standing Knee Flexion Stretch on Step  - 1-2 x daily - 7 x weekly - 3 sets - 10 reps - 10 seconds hold ?- Prone Quadriceps Stretch with Strap  - 1-2 x daily - 7 x weekly - 3 reps - 30-60 hold ?- Supine Quadriceps Stretch with Strap on Table  - 1-2 x daily - 7 x weekly - 3 reps - 30-60 hold ?

## 2021-10-09 ENCOUNTER — Ambulatory Visit (INDEPENDENT_AMBULATORY_CARE_PROVIDER_SITE_OTHER): Payer: PRIVATE HEALTH INSURANCE | Admitting: Orthopaedic Surgery

## 2021-10-09 ENCOUNTER — Ambulatory Visit (INDEPENDENT_AMBULATORY_CARE_PROVIDER_SITE_OTHER): Payer: PRIVATE HEALTH INSURANCE

## 2021-10-09 DIAGNOSIS — Z96652 Presence of left artificial knee joint: Secondary | ICD-10-CM | POA: Diagnosis not present

## 2021-10-09 MED ORDER — AMOXICILLIN 500 MG PO CAPS: 2000.0000 mg | ORAL_CAPSULE | Freq: Once | ORAL | 6 refills | Status: AC

## 2021-12-14 ENCOUNTER — Telehealth: Payer: Self-pay | Admitting: Orthopaedic Surgery

## 2021-12-14 ENCOUNTER — Other Ambulatory Visit: Payer: Self-pay | Admitting: Physician Assistant

## 2021-12-14 MED ORDER — AMOXICILLIN 500 MG PO CAPS: ORAL_CAPSULE | ORAL | 2 refills | Status: AC

## 2021-12-14 NOTE — Telephone Encounter (Signed)
Pt called requesting antibiotics for upcoming dental appt on this Tuesday. Please send to [facility] [place]. Pt phone number is [phone].

## 2021-12-14 NOTE — Telephone Encounter (Signed)
Sent in

## 2022-04-10 ENCOUNTER — Ambulatory Visit: Payer: PRIVATE HEALTH INSURANCE | Admitting: Orthopaedic Surgery

## 2022-07-17 ENCOUNTER — Other Ambulatory Visit: Payer: Self-pay | Admitting: Physician Assistant

## 2022-07-17 ENCOUNTER — Ambulatory Visit (INDEPENDENT_AMBULATORY_CARE_PROVIDER_SITE_OTHER): Payer: 59 | Admitting: Orthopaedic Surgery

## 2022-07-17 ENCOUNTER — Other Ambulatory Visit (INDEPENDENT_AMBULATORY_CARE_PROVIDER_SITE_OTHER): Payer: 59

## 2022-07-17 DIAGNOSIS — Z96652 Presence of left artificial knee joint: Secondary | ICD-10-CM

## 2022-07-17 NOTE — Progress Notes (Signed)
   Post-Op Visit Note   Patient: Carlos Aguirre           Date of Birth: 02-15-1971           MRN: [id number] Visit Date: 07/17/2022 PCP: Pcp, No   Assessment & Plan:  Chief Complaint:  Chief Complaint  Patient presents with   Left Knee - Pain   Visit Diagnoses:  1. History of total left knee replacement     Plan:   Patient is a 52 year old gentleman who comes in today little over a year out from a left total knee replacement 04/02/2021.  He notes that he has had chronic pain to the lateral knee since surgery.  He has associated swelling.  Symptoms are worse with flexion of the knee such as when he is dangling his leg over the bed/table, when he goes from seated to standing position as well as with stair climbing.  He has been taking Tylenol, NSAIDs and using ice with mild relief.  He denies any fevers or chills or any other constitutional symptoms.  He does note that he sustained a mechanical fall about 2 weeks ago landing on the anterior knee.  Symptoms are primarily unchanged but he does have increased pain to the patella since this injury.    Examination of the left knee shows a moderate effusion.  He does have tenderness along the lateral joint line as well as along the lateral patella facet.  Range of motion from 0 to 90 degrees.  He is stable to valgus and varus stress.  Extensor mechanism intact.  Calf is soft and nontender.  He is neurovascularly intact distally.  At this point, the knee appears to be stable.  No evidence of infection or loosening on x-ray.    Due to the persistent swelling to the left leg, we will go ahead and order a venous Doppler ultrasound to rule out DVT.  He will follow-up in 1 year for repeat evaluation and 2 view x-rays of the left knee.  Call with concerns or questions.  Follow-Up Instructions: Return in about 1 year (around 07/17/2023).   Orders:  Orders Placed This Encounter  Procedures   XR Knee 1-2 Views Left   No orders of the defined types  were placed in this encounter.   Imaging: XR Knee 1-2 Views Left  Result Date: 07/17/2022 Stable total knee replacement in good alignment.    PMFS History: Patient Active Problem List   Diagnosis Date Noted   Arthrofibrosis of knee joint, left 05/16/2021   Status post total knee replacement, left 04/02/2021   Primary osteoarthritis of left knee 02/02/2021   Past Medical History:  Diagnosis Date   Complication of anesthesia    Hard to wake up per patient    No family history on file.  Past Surgical History:  Procedure Laterality Date   HERNIA REPAIR     KNEE SURGERY     SHOULDER SURGERY     TOTAL KNEE ARTHROPLASTY Left 04/02/2021   Procedure: LEFT TOTAL KNEE ARTHROPLASTY;  Surgeon: Leandrew Koyanagi, MD;  Location: [facility];  Service: Orthopedics;  Laterality: Left;   Social History   Occupational History   Not on file  Tobacco Use   Smoking status: Never   Smokeless tobacco: Not on file  Vaping Use   Vaping Use: Never used  Substance and Sexual Activity   Alcohol use: No   Drug use: No   Sexual activity: Not on file

## 2022-07-18 ENCOUNTER — Encounter (HOSPITAL_COMMUNITY): Payer: PRIVATE HEALTH INSURANCE

## 2022-07-22 ENCOUNTER — Ambulatory Visit (HOSPITAL_COMMUNITY)
Admission: RE | Admit: 2022-07-22 | Discharge: 2022-07-22 | Disposition: A | Discharge status: Home / Self Care | Payer: 59 | Source: Ambulatory Visit | Attending: Surgery | Admitting: Surgery

## 2022-07-22 DIAGNOSIS — Z96652 Presence of left artificial knee joint: Secondary | ICD-10-CM | POA: Insufficient documentation

## 2023-01-08 ENCOUNTER — Other Ambulatory Visit (INDEPENDENT_AMBULATORY_CARE_PROVIDER_SITE_OTHER): Payer: Self-pay

## 2023-01-08 ENCOUNTER — Encounter: Payer: Self-pay | Admitting: Orthopaedic Surgery

## 2023-01-08 ENCOUNTER — Ambulatory Visit (INDEPENDENT_AMBULATORY_CARE_PROVIDER_SITE_OTHER): Payer: PRIVATE HEALTH INSURANCE | Admitting: Orthopaedic Surgery

## 2023-01-08 DIAGNOSIS — M25562 Pain in left knee: Secondary | ICD-10-CM

## 2023-01-08 DIAGNOSIS — Z96652 Presence of left artificial knee joint: Secondary | ICD-10-CM | POA: Diagnosis not present

## 2023-01-08 DIAGNOSIS — G8929 Other chronic pain: Secondary | ICD-10-CM

## 2023-01-08 DIAGNOSIS — M24662 Ankylosis, left knee: Secondary | ICD-10-CM | POA: Diagnosis not present

## 2023-01-08 NOTE — Progress Notes (Signed)
Office Visit Note   Patient: Carlos Aguirre           Date of Birth: November 05, 1970           MRN: [id number] Visit Date: 01/08/2023              Requested by: No referring provider defined for this encounter. PCP: Pcp, No   Assessment & Plan: Visit Diagnoses:  1. Status post total knee replacement, left   2. Chronic pain of left knee   3. Arthrofibrosis of knee joint, left     Plan: Carlos Aguirre is now nearly 2 years status post a left total knee replacement.  The implants are in good position without any evidence of complications.  He has struggled to regain flexion.  His extension is quite good.  He did require manipulation postoperatively initially.  He is reporting functional limitations because of the lack of flexion.  We discussed that we can revise the femoral component in order to help him improved flexion although the challenges to maintain the flexion postoperatively during the recovery period.  He will think about his options and let us know.  In the meantime I will obtain inflammatory markers to rule out infection.  Follow-Up Instructions: No follow-ups on file.   Orders:  Orders Placed This Encounter  Procedures   XR Knee 1-2 Views Left   No orders of the defined types were placed in this encounter.     Procedures: No procedures performed   Clinical Data: No additional findings.   Subjective: Chief Complaint  Patient presents with   Left Knee - Pain    left total knee replacement 04/02/2021    HPI Carlos Aguirre is a 52 year old gentleman who is almost 2 years status post left total knee replacement.  He has been complaining of left knee tightness and discomfort for a few months.  He has difficulty with stairs.  He feels the knee gives out on uneven surfaces.  He has to do a lot of walking for his job.  Denies any constitutional symptoms or injuries.  Feels a lot of crepitus in his knee.  Review of Systems  Constitutional: Negative.   HENT: Negative.    Eyes: Negative.    Respiratory: Negative.    Cardiovascular: Negative.   Gastrointestinal: Negative.   Endocrine: Negative.   Genitourinary: Negative.   Skin: Negative.   Allergic/Immunologic: Negative.   Neurological: Negative.   Hematological: Negative.   Psychiatric/Behavioral: Negative.    All other systems reviewed and are negative.    Objective: Vital Signs: There were no vitals taken for this visit.  Physical Exam Vitals and nursing note reviewed.  Constitutional:      Appearance: He is well-developed.  Pulmonary:     Effort: Pulmonary effort is normal.  Abdominal:     Palpations: Abdomen is soft.  Skin:    General: Skin is warm.  Neurological:     Mental Status: He is alert and oriented to person, place, and time.  Psychiatric:        Behavior: Behavior normal.        Thought Content: Thought content normal.        Judgment: Judgment normal.     Ortho Exam Exam of the left knee shows a fully healed surgical scar.  Range of motion is approximately 3 to 90 degrees.  Collaterals are stable.  No signs of infection. Specialty Comments:  No specialty comments available.  Imaging: XR Knee 1-2 Views Left  Result Date:  01/08/2023 X-rays of the left knee demonstrate a press-fit total knee arthroplasty without any evidence of loosening or subsidence.    PMFS History: Patient Active Problem List   Diagnosis Date Noted   Arthrofibrosis of knee joint, left 05/16/2021   Status post total knee replacement, left 04/02/2021   Primary osteoarthritis of left knee 02/02/2021   Past Medical History:  Diagnosis Date   Complication of anesthesia    Hard to wake up per patient    No family history on file.  Past Surgical History:  Procedure Laterality Date   HERNIA REPAIR     KNEE SURGERY     SHOULDER SURGERY     TOTAL KNEE ARTHROPLASTY Left 04/02/2021   Procedure: LEFT TOTAL KNEE ARTHROPLASTY;  Surgeon: Tarry Kos, MD;  Location: [facility];  Service: Orthopedics;  Laterality: Left;    Social History   Occupational History   Not on file  Tobacco Use   Smoking status: Never   Smokeless tobacco: Not on file  Vaping Use   Vaping status: Never Used  Substance and Sexual Activity   Alcohol use: No   Drug use: No   Sexual activity: Not on file

## 2023-01-09 LAB — CBC WITH DIFFERENTIAL/PLATELET
Absolute Monocytes: 546 cells/uL (ref 200–950)
Basophils Absolute: 55 cells/uL (ref 0–200)
Basophils Relative: 0.6 %
Eosinophils Absolute: 155 cells/uL (ref 15–500)
Eosinophils Relative: 1.7 %
HCT: 48.1 % (ref 38.5–50.0)
Hemoglobin: 16.6 g/dL (ref 13.2–17.1)
Lymphs Abs: 3140 cells/uL (ref 850–3900)
MCH: 29.7 pg (ref 27.0–33.0)
MCHC: 34.5 g/dL (ref 32.0–36.0)
MCV: 86 fL (ref 80.0–100.0)
MPV: 10.5 fL (ref 7.5–12.5)
Monocytes Relative: 6 %
Neutro Abs: 5205 cells/uL (ref 1500–7800)
Neutrophils Relative %: 57.2 %
Platelets: 271 10*3/uL (ref 140–400)
RBC: 5.59 10*6/uL (ref 4.20–5.80)
RDW: 13.1 % (ref 11.0–15.0)
Total Lymphocyte: 34.5 %
WBC: 9.1 10*3/uL (ref 3.8–10.8)

## 2023-01-09 LAB — SEDIMENTATION RATE: Sed Rate: 17 mm/h (ref 0–20)

## 2023-01-09 LAB — C-REACTIVE PROTEIN: CRP: 6.1 mg/L (ref <8.0)

## 2023-02-26 ENCOUNTER — Telehealth: Payer: Self-pay | Admitting: Orthopaedic Surgery

## 2023-02-26 NOTE — Telephone Encounter (Signed)
Patient called, needs to get copy of his RTW note. I printed and note was given to patient.

## 2023-03-17 IMAGING — DX DG KNEE 1-2V PORT*L*
1 series · 2 of 2 positions shown · non-contrast
Comparison: Radiograph dated February 02, 2021

CLINICAL DATA: Postop left knee replacement

EXAM:
PORTABLE LEFT KNEE - 1-2 VIEW

[Series 1: knee · 0.14mm/px · 2 of 2 slices shown]
[im 1/2]
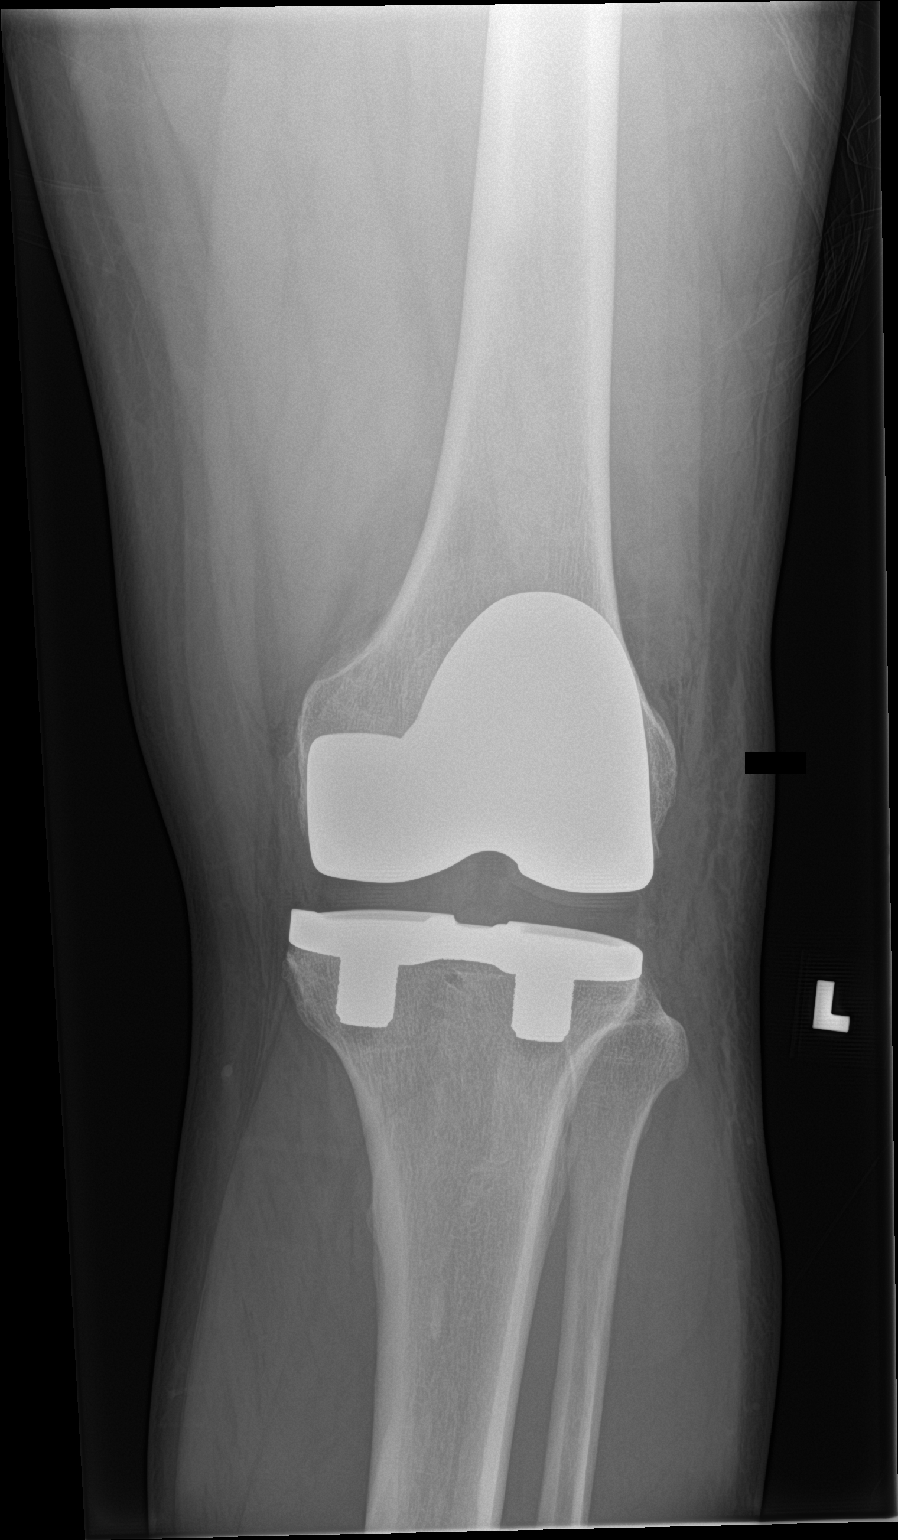
[im 2/2]
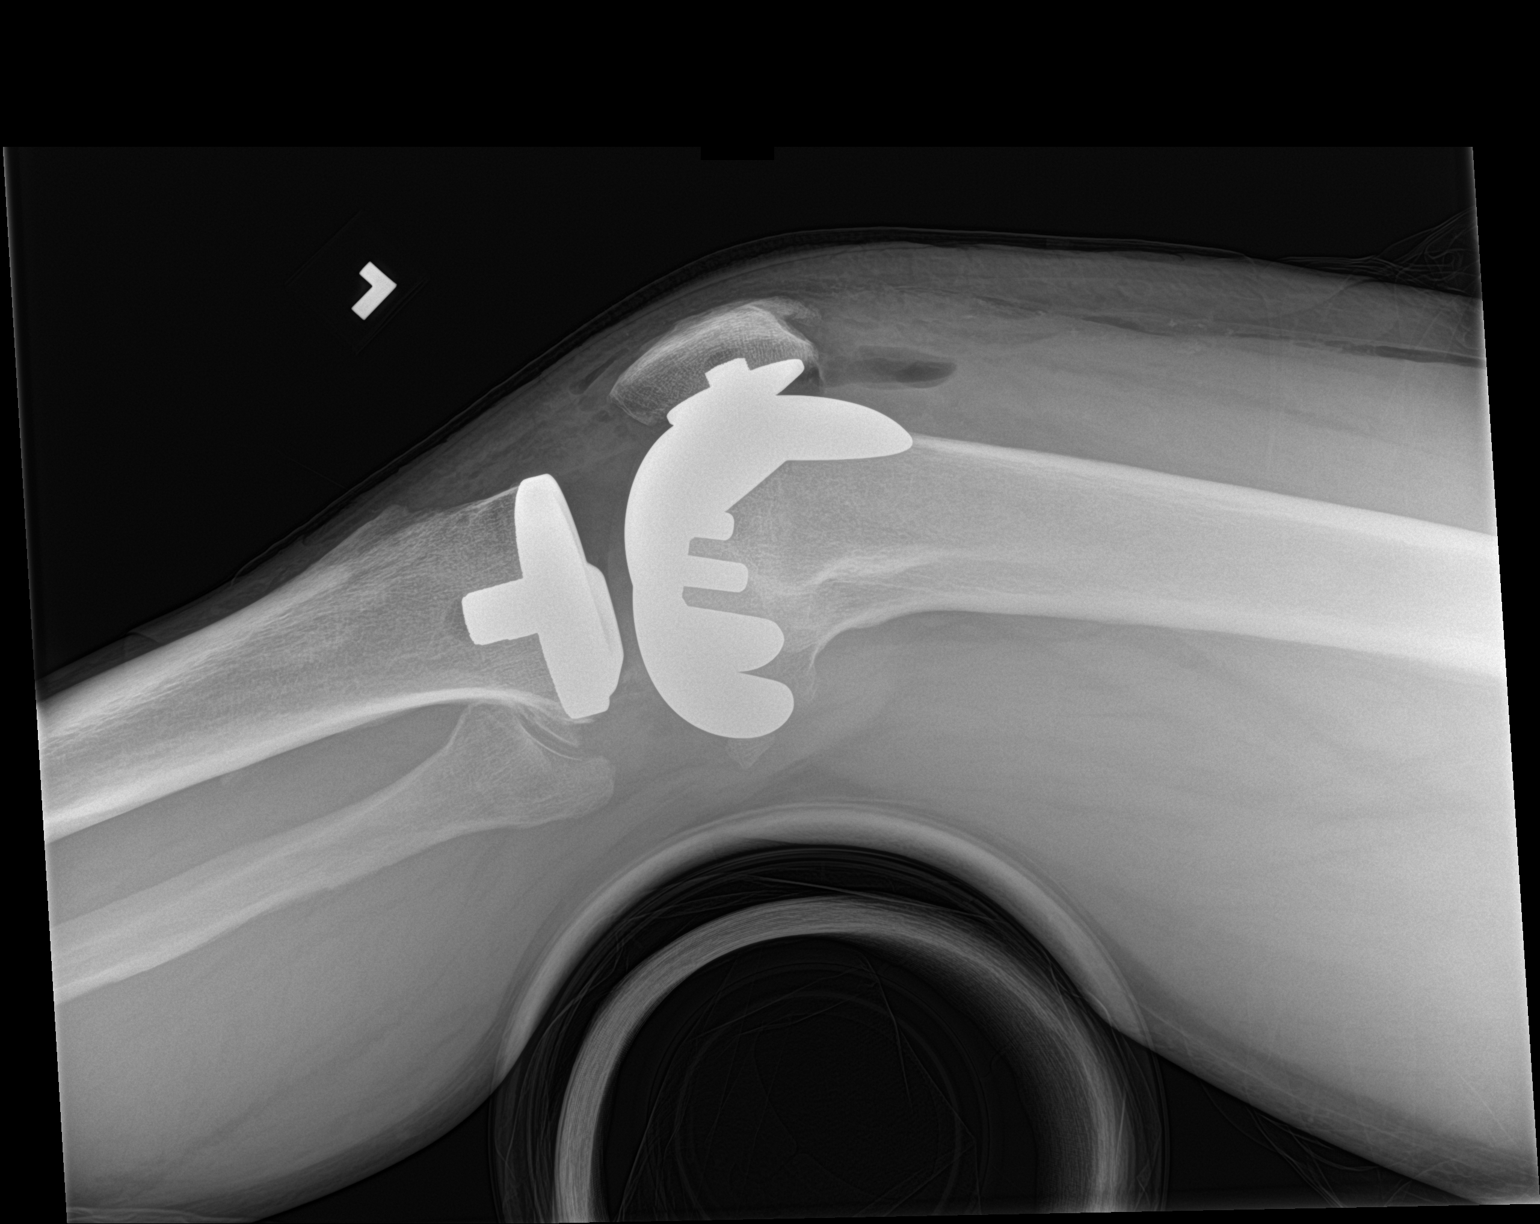

[2 of 2 positions shown; findings below may reference images not displayed]

FINDINGS: Status post left knee arthroplasty with intact hardware. No
periprosthetic fracture. Subcutaneous emphysema as expected.
IMPRESSION: Status post left knee arthroplasty without acute complications.
